# Patient Record
Sex: Female | Born: 1990 | Race: White | Hispanic: No | Marital: Married | Smoking: Former smoker
Health system: Southern US, Community
[De-identification: ages and names within clinical notes are randomized; demographics above are authoritative.]

## PROBLEM LIST (undated history)

## (undated) ENCOUNTER — Ambulatory Visit (HOSPITAL_BASED_OUTPATIENT_CLINIC_OR_DEPARTMENT_OTHER)

## (undated) DIAGNOSIS — I671 Cerebral aneurysm, nonruptured: Secondary | ICD-10-CM

## (undated) DIAGNOSIS — K219 Gastro-esophageal reflux disease without esophagitis: Secondary | ICD-10-CM

## (undated) DIAGNOSIS — F419 Anxiety disorder, unspecified: Secondary | ICD-10-CM

## (undated) DIAGNOSIS — Z62810 Personal history of physical and sexual abuse in childhood: Secondary | ICD-10-CM

## (undated) DIAGNOSIS — Q282 Arteriovenous malformation of cerebral vessels: Secondary | ICD-10-CM

## (undated) DIAGNOSIS — Z87442 Personal history of urinary calculi: Secondary | ICD-10-CM

## (undated) DIAGNOSIS — R87629 Unspecified abnormal cytological findings in specimens from vagina: Secondary | ICD-10-CM

## (undated) DIAGNOSIS — F329 Major depressive disorder, single episode, unspecified: Secondary | ICD-10-CM

## (undated) DIAGNOSIS — A609 Anogenital herpesviral infection, unspecified: Secondary | ICD-10-CM

## (undated) DIAGNOSIS — F32A Depression, unspecified: Secondary | ICD-10-CM

## (undated) HISTORY — PX: COLPOSCOPY: SHX161

## (undated) HISTORY — DX: Anogenital herpesviral infection, unspecified: A60.9

## (undated) HISTORY — DX: Major depressive disorder, single episode, unspecified: F32.9

## (undated) HISTORY — PX: TOOTH EXTRACTION: SUR596

## (undated) HISTORY — DX: Cerebral aneurysm, nonruptured: I67.1

## (undated) HISTORY — DX: Gastro-esophageal reflux disease without esophagitis: K21.9

## (undated) HISTORY — PX: TONSILLECTOMY: SUR1361

## (undated) HISTORY — DX: Personal history of physical and sexual abuse in childhood: Z62.810

## (undated) HISTORY — DX: Depression, unspecified: F32.A

## (undated) HISTORY — PX: MOUTH SURGERY: SHX715

## (undated) HISTORY — DX: Arteriovenous malformation of cerebral vessels: Q28.2

## (undated) HISTORY — DX: Anxiety disorder, unspecified: F41.9

---

## 2017-05-09 LAB — OB RESULTS CONSOLE GC/CHLAMYDIA
CHLAMYDIA, DNA PROBE: NEGATIVE
GC PROBE AMP, GENITAL: NEGATIVE

## 2017-05-09 LAB — OB RESULTS CONSOLE HIV ANTIBODY (ROUTINE TESTING): HIV: NONREACTIVE

## 2017-05-09 LAB — OB RESULTS CONSOLE RUBELLA ANTIBODY, IGM: RUBELLA: IMMUNE

## 2017-05-09 LAB — OB RESULTS CONSOLE ABO/RH: RH Type: POSITIVE

## 2017-05-09 LAB — OB RESULTS CONSOLE RPR: RPR: NONREACTIVE

## 2017-05-09 LAB — OB RESULTS CONSOLE HEPATITIS B SURFACE ANTIGEN: Hepatitis B Surface Ag: NEGATIVE

## 2017-05-09 LAB — OB RESULTS CONSOLE ANTIBODY SCREEN: ANTIBODY SCREEN: NEGATIVE

## 2017-12-09 ENCOUNTER — Telehealth (HOSPITAL_COMMUNITY): Payer: Self-pay | Admitting: *Deleted

## 2017-12-09 ENCOUNTER — Encounter (HOSPITAL_COMMUNITY): Payer: Self-pay | Admitting: *Deleted

## 2017-12-09 LAB — OB RESULTS CONSOLE GBS: STREP GROUP B AG: POSITIVE

## 2017-12-09 NOTE — Telephone Encounter (Signed)
Preadmission screen  

## 2017-12-20 ENCOUNTER — Inpatient Hospital Stay (HOSPITAL_COMMUNITY): Admission: AD | Admit: 2017-12-20 | Payer: Self-pay | Source: Ambulatory Visit | Admitting: Obstetrics and Gynecology
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
## 2017-12-23 ENCOUNTER — Encounter (HOSPITAL_COMMUNITY)
Admission: RE | Disposition: A | Discharge status: Home / Self Care | Payer: Self-pay | Source: Ambulatory Visit | Attending: Obstetrics and Gynecology

## 2017-12-23 ENCOUNTER — Encounter (HOSPITAL_COMMUNITY): Payer: Self-pay

## 2017-12-23 ENCOUNTER — Inpatient Hospital Stay (HOSPITAL_COMMUNITY): Payer: Managed Care, Other (non HMO) | Admitting: Anesthesiology

## 2017-12-23 ENCOUNTER — Inpatient Hospital Stay (HOSPITAL_COMMUNITY)
Admission: RE | Admit: 2017-12-23 | Discharge: 2017-12-25 | DRG: 787 | Disposition: A | Discharge status: Home / Self Care | Payer: Managed Care, Other (non HMO) | Source: Ambulatory Visit | Attending: Obstetrics and Gynecology | Admitting: Obstetrics and Gynecology

## 2017-12-23 ENCOUNTER — Other Ambulatory Visit: Payer: Self-pay

## 2017-12-23 VITALS — BP 114/61 | HR 67 | Temp 98.2°F | Resp 18 | Ht 67.0 in | Wt 171.0 lb

## 2017-12-23 DIAGNOSIS — O324XX0 Maternal care for high head at term, not applicable or unspecified: Secondary | ICD-10-CM | POA: Diagnosis present

## 2017-12-23 DIAGNOSIS — O480 Post-term pregnancy: Principal | ICD-10-CM | POA: Diagnosis present

## 2017-12-23 DIAGNOSIS — Z3A40 40 weeks gestation of pregnancy: Secondary | ICD-10-CM | POA: Diagnosis not present

## 2017-12-23 DIAGNOSIS — O99824 Streptococcus B carrier state complicating childbirth: Secondary | ICD-10-CM | POA: Diagnosis present

## 2017-12-23 DIAGNOSIS — A6000 Herpesviral infection of urogenital system, unspecified: Secondary | ICD-10-CM | POA: Diagnosis present

## 2017-12-23 DIAGNOSIS — Z98891 History of uterine scar from previous surgery: Secondary | ICD-10-CM

## 2017-12-23 DIAGNOSIS — Z3490 Encounter for supervision of normal pregnancy, unspecified, unspecified trimester: Secondary | ICD-10-CM

## 2017-12-23 DIAGNOSIS — O9832 Other infections with a predominantly sexual mode of transmission complicating childbirth: Secondary | ICD-10-CM | POA: Diagnosis present

## 2017-12-23 LAB — TYPE AND SCREEN
ABO/RH(D): O POS
Antibody Screen: NEGATIVE

## 2017-12-23 LAB — CBC
HEMATOCRIT: 37.3 % (ref 36.0–46.0)
Hemoglobin: 12.7 g/dL (ref 12.0–15.0)
MCH: 32.3 pg (ref 26.0–34.0)
MCHC: 34 g/dL (ref 30.0–36.0)
MCV: 94.9 fL (ref 78.0–100.0)
PLATELETS: 146 10*3/uL — AB (ref 150–400)
RBC: 3.93 MIL/uL (ref 3.87–5.11)
RDW: 13.4 % (ref 11.5–15.5)
WBC: 11.4 10*3/uL — AB (ref 4.0–10.5)

## 2017-12-23 LAB — ABO/RH: ABO/RH(D): O POS

## 2017-12-23 LAB — RPR: RPR Ser Ql: NONREACTIVE

## 2017-12-23 SURGERY — Surgical Case
Anesthesia: Epidural

## 2017-12-23 MED ORDER — NALBUPHINE HCL 10 MG/ML IJ SOLN: 5.0000 mg | Freq: Once | INTRAMUSCULAR | Status: DC | PRN

## 2017-12-23 MED ORDER — DIPHENHYDRAMINE HCL 25 MG PO CAPS: 25.0000 mg | ORAL_CAPSULE | ORAL | Status: DC | PRN

## 2017-12-23 MED ORDER — CEFAZOLIN SODIUM-DEXTROSE 2-4 GM/100ML-% IV SOLN
2.0000 g | Freq: Once | INTRAVENOUS | Status: DC
  Filled 2017-12-23: qty 100

## 2017-12-23 MED ORDER — DIPHENHYDRAMINE HCL 50 MG/ML IJ SOLN: 12.5000 mg | INTRAMUSCULAR | Status: DC | PRN

## 2017-12-23 MED ORDER — FENTANYL 2.5 MCG/ML BUPIVACAINE 1/10 % EPIDURAL INFUSION (WH - ANES)
INTRAMUSCULAR | Status: AC
  Filled 2017-12-23: qty 100

## 2017-12-23 MED ORDER — OXYCODONE-ACETAMINOPHEN 5-325 MG PO TABS: 2.0000 | ORAL_TABLET | ORAL | Status: DC | PRN

## 2017-12-23 MED ORDER — PENICILLIN G POT IN DEXTROSE 60000 UNIT/ML IV SOLN
3.0000 10*6.[IU] | INTRAVENOUS | Status: DC
  Administered 2017-12-23 (×2): 3 10*6.[IU] via INTRAVENOUS
  Filled 2017-12-23 (×6): qty 50

## 2017-12-23 MED ORDER — SIMETHICONE 80 MG PO CHEW
80.0000 mg | CHEWABLE_TABLET | ORAL | Status: DC
  Administered 2017-12-24 (×2): 80 mg via ORAL
  Filled 2017-12-23 (×2): qty 1

## 2017-12-23 MED ORDER — FENTANYL CITRATE (PF) 100 MCG/2ML IJ SOLN
50.0000 ug | INTRAMUSCULAR | Status: DC | PRN
  Administered 2017-12-23: 100 ug via INTRAVENOUS
  Administered 2017-12-23: 50 ug via INTRAVENOUS
  Administered 2017-12-23: 100 ug via INTRAVENOUS
  Filled 2017-12-23 (×3): qty 2

## 2017-12-23 MED ORDER — OXYTOCIN 40 UNITS IN LACTATED RINGERS INFUSION - SIMPLE MED: 2.5000 [IU]/h | INTRAVENOUS | Status: DC

## 2017-12-23 MED ORDER — PHENYLEPHRINE 40 MCG/ML (10ML) SYRINGE FOR IV PUSH (FOR BLOOD PRESSURE SUPPORT): 80.0000 ug | PREFILLED_SYRINGE | INTRAVENOUS | Status: DC | PRN

## 2017-12-23 MED ORDER — SODIUM BICARBONATE 8.4 % IV SOLN
INTRAVENOUS | Status: DC | PRN
  Administered 2017-12-23: 5 mL via EPIDURAL
  Administered 2017-12-23: 2 mL via EPIDURAL
  Administered 2017-12-23: 3 mL via EPIDURAL

## 2017-12-23 MED ORDER — PRENATAL MULTIVITAMIN CH
1.0000 | ORAL_TABLET | Freq: Every day | ORAL | Status: DC
  Administered 2017-12-24 – 2017-12-25 (×2): 1 via ORAL
  Filled 2017-12-23 (×2): qty 1

## 2017-12-23 MED ORDER — MISOPROSTOL 25 MCG QUARTER TABLET
25.0000 ug | ORAL_TABLET | ORAL | Status: DC | PRN
  Administered 2017-12-23 (×2): 25 ug via VAGINAL
  Filled 2017-12-23 (×2): qty 1

## 2017-12-23 MED ORDER — TETANUS-DIPHTH-ACELL PERTUSSIS 5-2.5-18.5 LF-MCG/0.5 IM SUSP: 0.5000 mL | Freq: Once | INTRAMUSCULAR | Status: DC

## 2017-12-23 MED ORDER — TERBUTALINE SULFATE 1 MG/ML IJ SOLN: 0.2500 mg | Freq: Once | INTRAMUSCULAR | Status: DC | PRN

## 2017-12-23 MED ORDER — SODIUM CHLORIDE 0.9% FLUSH: 3.0000 mL | INTRAVENOUS | Status: DC | PRN

## 2017-12-23 MED ORDER — SODIUM CHLORIDE 0.9 % IV SOLN
5.0000 10*6.[IU] | Freq: Once | INTRAVENOUS | Status: AC
  Administered 2017-12-23: 5 10*6.[IU] via INTRAVENOUS
  Filled 2017-12-23: qty 5

## 2017-12-23 MED ORDER — FENTANYL 2.5 MCG/ML BUPIVACAINE 1/10 % EPIDURAL INFUSION (WH - ANES)
14.0000 mL/h | INTRAMUSCULAR | Status: DC | PRN
  Administered 2017-12-23 (×2): 14 mL/h via EPIDURAL
  Filled 2017-12-23: qty 100

## 2017-12-23 MED ORDER — OXYTOCIN BOLUS FROM INFUSION: 500.0000 mL | Freq: Once | INTRAVENOUS | Status: DC

## 2017-12-23 MED ORDER — KETOROLAC TROMETHAMINE 30 MG/ML IJ SOLN
30.0000 mg | Freq: Once | INTRAMUSCULAR | Status: AC
  Administered 2017-12-23: 30 mg via INTRAVENOUS

## 2017-12-23 MED ORDER — PHENYLEPHRINE 40 MCG/ML (10ML) SYRINGE FOR IV PUSH (FOR BLOOD PRESSURE SUPPORT)
PREFILLED_SYRINGE | INTRAVENOUS | Status: AC
  Filled 2017-12-23: qty 10

## 2017-12-23 MED ORDER — MEPERIDINE HCL 25 MG/ML IJ SOLN: 6.2500 mg | INTRAMUSCULAR | Status: DC | PRN

## 2017-12-23 MED ORDER — DEXAMETHASONE SODIUM PHOSPHATE 4 MG/ML IJ SOLN
INTRAMUSCULAR | Status: DC | PRN
  Administered 2017-12-23: 4 mg via INTRAVENOUS

## 2017-12-23 MED ORDER — MORPHINE SULFATE (PF) 0.5 MG/ML IJ SOLN
INTRAMUSCULAR | Status: AC
Start: 2017-12-23 — End: 2017-12-23
  Filled 2017-12-23: qty 10

## 2017-12-23 MED ORDER — LACTATED RINGERS IV SOLN: 500.0000 mL | INTRAVENOUS | Status: DC | PRN

## 2017-12-23 MED ORDER — OXYTOCIN 40 UNITS IN LACTATED RINGERS INFUSION - SIMPLE MED: 2.5000 [IU]/h | INTRAVENOUS | Status: AC

## 2017-12-23 MED ORDER — LIDOCAINE-EPINEPHRINE (PF) 2 %-1:200000 IJ SOLN: INTRAMUSCULAR | Status: DC | PRN

## 2017-12-23 MED ORDER — FENTANYL CITRATE (PF) 100 MCG/2ML IJ SOLN: 25.0000 ug | INTRAMUSCULAR | Status: DC | PRN

## 2017-12-23 MED ORDER — LIDOCAINE HCL (PF) 1 % IJ SOLN
INTRAMUSCULAR | Status: DC | PRN
  Administered 2017-12-23: 5 mL via EPIDURAL
  Administered 2017-12-23: 4 mL via EPIDURAL

## 2017-12-23 MED ORDER — OXYTOCIN 10 UNIT/ML IJ SOLN
INTRAVENOUS | Status: DC | PRN
  Administered 2017-12-23: 40 [IU] via INTRAVENOUS

## 2017-12-23 MED ORDER — ACETAMINOPHEN 325 MG PO TABS: 650.0000 mg | ORAL_TABLET | ORAL | Status: DC | PRN

## 2017-12-23 MED ORDER — LACTATED RINGERS IV SOLN
INTRAVENOUS | Status: DC | PRN
  Administered 2017-12-23: 18:00:00 via INTRAVENOUS

## 2017-12-23 MED ORDER — ONDANSETRON HCL 4 MG/2ML IJ SOLN: 4.0000 mg | Freq: Three times a day (TID) | INTRAMUSCULAR | Status: DC | PRN

## 2017-12-23 MED ORDER — NALOXONE HCL 0.4 MG/ML IJ SOLN: 0.4000 mg | INTRAMUSCULAR | Status: DC | PRN

## 2017-12-23 MED ORDER — LACTATED RINGERS IV SOLN
INTRAVENOUS | Status: DC
  Administered 2017-12-23 (×2): via INTRAVENOUS

## 2017-12-23 MED ORDER — ONDANSETRON HCL 4 MG/2ML IJ SOLN
4.0000 mg | Freq: Four times a day (QID) | INTRAMUSCULAR | Status: DC | PRN
  Administered 2017-12-23: 4 mg via INTRAVENOUS
  Filled 2017-12-23: qty 2

## 2017-12-23 MED ORDER — OXYTOCIN 10 UNIT/ML IJ SOLN
INTRAMUSCULAR | Status: AC
  Filled 2017-12-23: qty 4

## 2017-12-23 MED ORDER — FLEET ENEMA 7-19 GM/118ML RE ENEM: 1.0000 | ENEMA | RECTAL | Status: DC | PRN

## 2017-12-23 MED ORDER — ACETAMINOPHEN 325 MG PO TABS
650.0000 mg | ORAL_TABLET | ORAL | Status: DC | PRN
  Administered 2017-12-24 – 2017-12-25 (×5): 650 mg via ORAL
  Filled 2017-12-23 (×5): qty 2

## 2017-12-23 MED ORDER — COCONUT OIL OIL
1.0000 "application " | TOPICAL_OIL | Status: DC | PRN
  Administered 2017-12-25: 1 via TOPICAL
  Filled 2017-12-23: qty 120

## 2017-12-23 MED ORDER — FENTANYL CITRATE (PF) 100 MCG/2ML IJ SOLN
INTRAMUSCULAR | Status: DC | PRN
  Administered 2017-12-23: 50 ug via EPIDURAL
  Administered 2017-12-23: 50 ug via INTRAVENOUS

## 2017-12-23 MED ORDER — LIDOCAINE-EPINEPHRINE (PF) 2 %-1:200000 IJ SOLN
INTRAMUSCULAR | Status: DC | PRN
  Administered 2017-12-23: 10 mL via INTRADERMAL

## 2017-12-23 MED ORDER — MENTHOL 3 MG MT LOZG
1.0000 | LOZENGE | OROMUCOSAL | Status: DC | PRN
Start: 2017-12-23 — End: 2017-12-25

## 2017-12-23 MED ORDER — OXYCODONE-ACETAMINOPHEN 5-325 MG PO TABS: 1.0000 | ORAL_TABLET | ORAL | Status: DC | PRN

## 2017-12-23 MED ORDER — SIMETHICONE 80 MG PO CHEW: 80.0000 mg | CHEWABLE_TABLET | ORAL | Status: DC | PRN

## 2017-12-23 MED ORDER — NALBUPHINE HCL 10 MG/ML IJ SOLN: 5.0000 mg | INTRAMUSCULAR | Status: DC | PRN

## 2017-12-23 MED ORDER — SODIUM CHLORIDE 0.9 % IR SOLN
Status: DC | PRN
  Administered 2017-12-23: 1

## 2017-12-23 MED ORDER — LIDOCAINE HCL (PF) 1 % IJ SOLN: 30.0000 mL | INTRAMUSCULAR | Status: DC | PRN

## 2017-12-23 MED ORDER — EPHEDRINE 5 MG/ML INJ: 10.0000 mg | INTRAVENOUS | Status: DC | PRN

## 2017-12-23 MED ORDER — MORPHINE SULFATE (PF) 0.5 MG/ML IJ SOLN
INTRAMUSCULAR | Status: DC | PRN
  Administered 2017-12-23: 3 mg via EPIDURAL

## 2017-12-23 MED ORDER — SIMETHICONE 80 MG PO CHEW
80.0000 mg | CHEWABLE_TABLET | Freq: Three times a day (TID) | ORAL | Status: DC
  Administered 2017-12-24 – 2017-12-25 (×5): 80 mg via ORAL
  Filled 2017-12-23 (×5): qty 1

## 2017-12-23 MED ORDER — OXYCODONE HCL 5 MG PO TABS
5.0000 mg | ORAL_TABLET | ORAL | Status: DC | PRN
  Administered 2017-12-24 – 2017-12-25 (×3): 5 mg via ORAL
  Filled 2017-12-23 (×3): qty 1

## 2017-12-23 MED ORDER — CEFAZOLIN SODIUM-DEXTROSE 2-3 GM-%(50ML) IV SOLR
INTRAVENOUS | Status: DC | PRN
  Administered 2017-12-23: 2 g via INTRAVENOUS

## 2017-12-23 MED ORDER — SCOPOLAMINE 1 MG/3DAYS TD PT72
MEDICATED_PATCH | TRANSDERMAL | Status: AC
  Filled 2017-12-23: qty 1

## 2017-12-23 MED ORDER — IBUPROFEN 600 MG PO TABS
600.0000 mg | ORAL_TABLET | Freq: Four times a day (QID) | ORAL | Status: DC
  Administered 2017-12-24 – 2017-12-25 (×7): 600 mg via ORAL
  Filled 2017-12-23 (×7): qty 1

## 2017-12-23 MED ORDER — LIDOCAINE-EPINEPHRINE (PF) 2 %-1:200000 IJ SOLN
INTRAMUSCULAR | Status: AC
  Filled 2017-12-23: qty 20

## 2017-12-23 MED ORDER — DIBUCAINE 1 % RE OINT: 1.0000 "application " | TOPICAL_OINTMENT | RECTAL | Status: DC | PRN

## 2017-12-23 MED ORDER — SCOPOLAMINE 1 MG/3DAYS TD PT72
MEDICATED_PATCH | TRANSDERMAL | Status: DC | PRN
  Administered 2017-12-23: 1 via TRANSDERMAL

## 2017-12-23 MED ORDER — LACTATED RINGERS IV SOLN: 500.0000 mL | Freq: Once | INTRAVENOUS | Status: DC

## 2017-12-23 MED ORDER — LACTATED RINGERS IV SOLN
INTRAVENOUS | Status: DC | PRN
  Administered 2017-12-23: 17:00:00 via INTRAVENOUS

## 2017-12-23 MED ORDER — LACTATED RINGERS IV SOLN
500.0000 mL | Freq: Once | INTRAVENOUS | Status: AC
  Administered 2017-12-23: 500 mL via INTRAVENOUS

## 2017-12-23 MED ORDER — OXYTOCIN 40 UNITS IN LACTATED RINGERS INFUSION - SIMPLE MED
1.0000 m[IU]/min | INTRAVENOUS | Status: DC
  Administered 2017-12-23: 2 m[IU]/min via INTRAVENOUS
  Filled 2017-12-23: qty 1000

## 2017-12-23 MED ORDER — ZOLPIDEM TARTRATE 5 MG PO TABS: 5.0000 mg | ORAL_TABLET | Freq: Every evening | ORAL | Status: DC | PRN

## 2017-12-23 MED ORDER — NALOXONE HCL 4 MG/10ML IJ SOLN: 1.0000 ug/kg/h | INTRAVENOUS | Status: DC | PRN

## 2017-12-23 MED ORDER — LACTATED RINGERS IV SOLN
INTRAVENOUS | Status: DC
  Administered 2017-12-24: 03:00:00 via INTRAVENOUS

## 2017-12-23 MED ORDER — OXYCODONE HCL 5 MG PO TABS
10.0000 mg | ORAL_TABLET | ORAL | Status: DC | PRN
  Administered 2017-12-24 – 2017-12-25 (×3): 10 mg via ORAL
  Filled 2017-12-23 (×3): qty 2

## 2017-12-23 MED ORDER — FENTANYL CITRATE (PF) 100 MCG/2ML IJ SOLN
INTRAMUSCULAR | Status: AC
  Filled 2017-12-23: qty 2

## 2017-12-23 MED ORDER — SENNOSIDES-DOCUSATE SODIUM 8.6-50 MG PO TABS
2.0000 | ORAL_TABLET | ORAL | Status: DC
  Administered 2017-12-24 (×2): 2 via ORAL
  Filled 2017-12-23 (×2): qty 2

## 2017-12-23 MED ORDER — DIPHENHYDRAMINE HCL 25 MG PO CAPS: 25.0000 mg | ORAL_CAPSULE | Freq: Four times a day (QID) | ORAL | Status: DC | PRN

## 2017-12-23 MED ORDER — DEXAMETHASONE SODIUM PHOSPHATE 4 MG/ML IJ SOLN
INTRAMUSCULAR | Status: AC
  Filled 2017-12-23: qty 1

## 2017-12-23 MED ORDER — ONDANSETRON HCL 4 MG/2ML IJ SOLN
INTRAMUSCULAR | Status: AC
  Filled 2017-12-23: qty 2

## 2017-12-23 MED ORDER — WITCH HAZEL-GLYCERIN EX PADS: 1.0000 "application " | MEDICATED_PAD | CUTANEOUS | Status: DC | PRN

## 2017-12-23 MED ORDER — KETOROLAC TROMETHAMINE 30 MG/ML IJ SOLN
INTRAMUSCULAR | Status: AC
  Filled 2017-12-23: qty 1

## 2017-12-23 MED ORDER — SOD CITRATE-CITRIC ACID 500-334 MG/5ML PO SOLN
30.0000 mL | ORAL | Status: DC | PRN
  Administered 2017-12-23: 30 mL via ORAL
  Filled 2017-12-23: qty 15

## 2017-12-23 SURGICAL SUPPLY — 37 items
BENZOIN TINCTURE PRP APPL 2/3 (GAUZE/BANDAGES/DRESSINGS) ×3 IMPLANT
CHLORAPREP W/TINT 26ML (MISCELLANEOUS) ×3 IMPLANT
CLAMP CORD UMBIL (MISCELLANEOUS) IMPLANT
CLOSURE WOUND 1/2 X4 (GAUZE/BANDAGES/DRESSINGS) ×1
CLOTH BEACON ORANGE TIMEOUT ST (SAFETY) ×3 IMPLANT
DERMABOND ADVANCED (GAUZE/BANDAGES/DRESSINGS) ×2
DERMABOND ADVANCED .7 DNX12 (GAUZE/BANDAGES/DRESSINGS) ×1 IMPLANT
DRSG OPSITE POSTOP 4X10 (GAUZE/BANDAGES/DRESSINGS) ×3 IMPLANT
ELECT REM PT RETURN 9FT ADLT (ELECTROSURGICAL) ×3
ELECTRODE REM PT RTRN 9FT ADLT (ELECTROSURGICAL) ×1 IMPLANT
EXTRACTOR VACUUM KIWI (MISCELLANEOUS) IMPLANT
GLOVE BIO SURGEON STRL SZ 6.5 (GLOVE) ×2 IMPLANT
GLOVE BIO SURGEONS STRL SZ 6.5 (GLOVE) ×1
GLOVE BIOGEL PI IND STRL 6.5 (GLOVE) ×1 IMPLANT
GLOVE BIOGEL PI IND STRL 7.0 (GLOVE) ×2 IMPLANT
GLOVE BIOGEL PI INDICATOR 6.5 (GLOVE) ×2
GLOVE BIOGEL PI INDICATOR 7.0 (GLOVE) ×4
GOWN STRL REUS W/TWL LRG LVL3 (GOWN DISPOSABLE) ×6 IMPLANT
KIT ABG SYR 3ML LUER SLIP (SYRINGE) ×6 IMPLANT
NEEDLE HYPO 25X5/8 SAFETYGLIDE (NEEDLE) ×6 IMPLANT
NS IRRIG 1000ML POUR BTL (IV SOLUTION) ×3 IMPLANT
PACK C SECTION WH (CUSTOM PROCEDURE TRAY) ×3 IMPLANT
PAD OB MATERNITY 4.3X12.25 (PERSONAL CARE ITEMS) ×3 IMPLANT
PENCIL SMOKE EVAC W/HOLSTER (ELECTROSURGICAL) ×3 IMPLANT
RETRACTOR WND ALEXIS 25 LRG (MISCELLANEOUS) IMPLANT
RTRCTR WOUND ALEXIS 25CM LRG (MISCELLANEOUS)
STRIP CLOSURE SKIN 1/2X4 (GAUZE/BANDAGES/DRESSINGS) ×2 IMPLANT
SUT MON AB 4-0 PS1 27 (SUTURE) ×3 IMPLANT
SUT PLAIN 0 NONE (SUTURE) IMPLANT
SUT PLAIN 2 0 (SUTURE) ×2
SUT PLAIN ABS 2-0 CT1 27XMFL (SUTURE) ×1 IMPLANT
SUT VIC AB 0 CT1 36 (SUTURE) ×3 IMPLANT
SUT VIC AB 0 CTX 36 (SUTURE) ×6
SUT VIC AB 0 CTX36XBRD ANBCTRL (SUTURE) ×3 IMPLANT
SUT VIC AB 4-0 PS2 27 (SUTURE) ×3 IMPLANT
TOWEL OR 17X24 6PK STRL BLUE (TOWEL DISPOSABLE) ×3 IMPLANT
TRAY FOLEY BAG SILVER LF 14FR (SET/KITS/TRAYS/PACK) IMPLANT

## 2017-12-23 NOTE — Anesthesia Procedure Notes (Signed)
Epidural Patient location during procedure: OB Start time: 12/23/2017 8:10 AM  Staffing Anesthesiologist: Mal AmabileFoster, Syon Tews, MD Performed: anesthesiologist   Preanesthetic Checklist Completed: patient identified, site marked, surgical consent, pre-op evaluation, timeout performed, IV checked, risks and benefits discussed and monitors and equipment checked  Epidural Patient position: sitting Prep: site prepped and draped and DuraPrep Patient monitoring: continuous pulse ox and blood pressure Approach: midline Location: L3-L4 Injection technique: LOR air  Needle:  Needle type: Tuohy  Needle gauge: 17 G Needle length: 9 cm and 9 Needle insertion depth: 5 cm cm Catheter type: closed end flexible Catheter size: 19 Gauge Catheter at skin depth: 10 cm Test dose: negative and Other  Assessment Events: blood not aspirated, injection not painful, no injection resistance, negative IV test and no paresthesia  Additional Notes Patient identified. Risks and benefits discussed including failed block, incomplete  Pain control, post dural puncture headache, nerve damage, paralysis, blood pressure Changes, nausea, vomiting, reactions to medications-both toxic and allergic and post Partum back pain. All questions were answered. Patient expressed understanding and wished to proceed. Sterile technique was used throughout procedure. Epidural site was Dressed with sterile barrier dressing. No paresthesias, signs of intravascular injection Or signs of intrathecal spread were encountered.  Patient was more comfortable after the epidural was dosed. Please see RN's note for documentation of vital signs and FHR which are stable.

## 2017-12-23 NOTE — Anesthesia Pain Management Evaluation Note (Signed)
  CRNA Pain Management Visit Note  Patient: Wendy Potter, 27 y.o., female  "Hello I am a member of the anesthesia team at [facility]. We have an anesthesia team available at all times to provide care throughout the hospital, including epidural management and anesthesia for C-section. I don't know your plan for the delivery whether it a natural birth, water birth, IV sedation, nitrous supplementation, doula or epidural, but we want to meet your pain goals."   1.Was your pain managed to your expectations on prior hospitalizations?   Yes   2.What is your expectation for pain management during this hospitalization?     Epidural  3.How can we help you reach that goal? Epidural recently placed and pt stated that she is not having any pain.  Record the patient's initial score and the patient's pain goal.   Pain: 0  Pain Goal: 0 [facility] wants you to be able to say your pain was always managed very well.  Wendy Potter 12/23/2017

## 2017-12-23 NOTE — Progress Notes (Signed)
Patient has been pushing for >1 hour and absolutely no descent of the fetal vertex. Effort has been excellent, IUPC shows great strength. D/w patient, husband, and grandmothers a primary C section for arrest of descent. Risks reviewed including infection, bleeding, damage to surrounding structures, and the need for additional procedures. Patient gave informed consent and desired to proceed with primary cesarean section. 2g ancef on call to OR.   Rosie FateE Kenny Rea MD

## 2017-12-23 NOTE — Transfer of Care (Signed)
Immediate Anesthesia Transfer of Care Note  Patient: Wendy FusiBrenda A Potter  Procedure(s) Performed: CESAREAN SECTION (N/A )  Patient Location: PACU  Anesthesia Type:Epidural  Level of Consciousness: awake, alert  and oriented  Airway & Oxygen Therapy: Patient Spontanous Breathing  Post-op Assessment: Report given to RN and Post -op Vital signs reviewed and stable  Post vital signs: Reviewed and stable  Last Vitals:  Vitals:   12/23/17 1701 12/23/17 1705  BP: 123/67 (!) 144/125  Pulse: 71 (!) 136  Resp:    Temp:      Last Pain:  Vitals:   12/23/17 1152  TempSrc:   PainSc: 3          Complications: No apparent anesthesia complications

## 2017-12-23 NOTE — Progress Notes (Addendum)
SVE 5/c/-2, AROM for clear fluid. CLE now.  S/p PCN x 3 doses.  Cont PCN per protocol.

## 2017-12-23 NOTE — Progress Notes (Signed)
9/c/+1, OT --> LOA Anticipate second stage soon

## 2017-12-23 NOTE — Op Note (Signed)
PROCEDURE DATE: 12/23/17  PREOPERATIVE DIAGNOSIS: Intrauterine pregnancy at 40.3 wga, Indication: arrest of descent  POSTOPERATIVE DIAGNOSIS:The same  PROCEDURE: Primary Low TransverseCesarean Section  SURGEON: Dr. Belva AgeeElise Mahmoud Blazejewski  INDICATIONS:This is a 26yo G1 at 40.3 wga requiring cesarean section secondary to arrest of descent. She progressed well through labor, pushed for >1 hour without any descent. Decision made to proceed with pLTCS.The risks of cesarean section discussed with the patient included but were not limited to: bleeding which may require transfusion or reoperation; infection which may require antibiotics; injury to bowel, bladder, ureters or other surrounding organs; injury to the fetus; need for additional procedures including hysterectomy in the event of a life-threatening hemorrhage; placental abnormalities wth subsequent pregnancies, incisional problems, thromboembolic phenomenon and other postoperative/anesthesia complications. The patient agreed with the proposed plan, giving informed consent for the procedure.   FINDINGS: Viable maleinfant in vertex presentation,APGARs pending, Weight pending, Amniotic fluid clear, Intact placenta, three vessel cord. Grossly normal uterus, ovaries and fallopian tubes. .  ANESTHESIA: Epidural ESTIMATED BLOOD LOSS: 635cc SPECIMENS: Placenta for routine COMPLICATIONS: None immediate  PROCEDURE IN DETAIL: The patient received intravenous antibiotics (2g Ancef) and had sequential compression devices applied to her lower extremities while in the preoperative area. Shewasthen taken to the operating roomwhere epidural anesthesiawas dosed up to surgical level andwas found to be adequate. She was then placed in a dorsal supine position with a leftward tilt,and prepped and draped in a sterile manner.A foley catheter was placed into her bladder and attached to constant gravity. After an adequate timeout was performed,  aPfannenstiel skin incision was made with scalpel and carried through to the underlying layer of fascia. The fascia was incised in the midline and this incision was extended bilaterally using the Mayo scissors. Kocher clamps were applied to the superior aspect of the fascial incision and the underlying rectus muscles were dissected off bluntly. A similar process was carried out on the inferior aspect of the facial incision. The rectus muscles were separated in the midline bluntly and the peritoneum was entered bluntly. A bladder flap was created sharply and developed bluntly.Atransverse hysterotomy was made with a scalpel and extended bilaterally bluntly. The bladder blade was then removed. The infant was successfully delivered, and cord was clamped and cut and infant was handed over to awaiting neonatology team. Uterine massage was then administered and the placenta delivered intact with three-vessel cord. Cord gases were taken. The uterus was cleared of clot and debris. The hysterotomy was closed with 0 vicryl.A second imbricating suture of 0-vicryl was used to reinforce the incision and aid in hemostasis.The fascia was closed with 0-Vicryl in a running fashion with good restoration of anatomy. The subcutaneus tissue was irrigated and was reapproximated using three interrupted plain gut stitches. The skin was closed with 4-0 Vicryl in a subcuticular fashion.  Final EBL was 635cc (all surgical site and was hemostatic at end of procedure) without any further bleeding on exam.   Pt tolerated the procedure well. All sponge/lap/needle counts were correct X 2. Pt taken to recovery room in stable condition.   Belva AgeeElise Heberto Sturdevant MD

## 2017-12-23 NOTE — Anesthesia Preprocedure Evaluation (Addendum)
Anesthesia Evaluation  Patient identified by MRN, date of birth, ID band Patient awake    Reviewed: Allergy & Precautions, H&P , NPO status , Patient's Chart, lab work & pertinent test results  Airway Mallampati: II  TM Distance: >3 FB Neck ROM: full    Dental no notable dental hx. (+) Teeth Intact   Pulmonary neg pulmonary ROS, Current Smoker,    Pulmonary exam normal breath sounds clear to auscultation       Cardiovascular negative cardio ROS Normal cardiovascular exam Rhythm:regular Rate:Normal     Neuro/Psych negative neurological ROS  negative psych ROS   GI/Hepatic Neg liver ROS, GERD  Medicated and Controlled,  Endo/Other  negative endocrine ROS  Renal/GU negative Renal ROS  negative genitourinary   Musculoskeletal   Abdominal   Peds  Hematology negative hematology ROS (+)   Anesthesia Other Findings   Reproductive/Obstetrics (+) Pregnancy HSV                            Anesthesia Physical Anesthesia Plan  ASA: II and emergent  Anesthesia Plan: Epidural   Post-op Pain Management:    Induction:   PONV Risk Score and Plan:   Airway Management Planned: Natural Airway  Additional Equipment:   Intra-op Plan:   Post-operative Plan:   Informed Consent: I have reviewed the patients History and Physical, chart, labs and discussed the procedure including the risks, benefits and alternatives for the proposed anesthesia with the patient or authorized representative who has indicated his/her understanding and acceptance.   Dental advisory given  Plan Discussed with: CRNA and Surgeon  Anesthesia Plan Comments: (For urgent C/Section for arrest of descent. Will use epidural catheter.)       Anesthesia Quick Evaluation

## 2017-12-23 NOTE — Brief Op Note (Signed)
12/23/2017  7:25 PM  PATIENT:  Wendy FusiBrenda A Smouse  27 y.o. female  PRE-OPERATIVE DIAGNOSIS:  arrest of descent   POST-OPERATIVE DIAGNOSIS:  arrest of descent   PROCEDURE:  Procedure(s): CESAREAN SECTION (N/A)  SURGEON:  Surgeon(s) and Role:    * Mellody Masri, Madelaine EtienneElise Jennifer, MD - Primary  PHYSICIAN ASSISTANT:   ASSISTANTS: Mamie Leversracy Tucker, RfNA  ANESTHESIA:   epidural  EBL:  635 mL   BLOOD ADMINISTERED:none  DRAINS: Urinary Catheter (Foley)   LOCAL MEDICATIONS USED:  NONE  SPECIMEN:  Source of Specimen:  placenta  DISPOSITION OF SPECIMEN:  L&D  COUNTS:  YES  TOURNIQUET:  * No tourniquets in log *  DICTATION: .Note written in EPIC  PLAN OF CARE: Admit to inpatient   PATIENT DISPOSITION:  PACU - hemodynamically stable.   Delay start of Pharmacological VTE agent (>24hrs) due to surgical blood loss or risk of bleeding: not applicable

## 2017-12-23 NOTE — Anesthesia Postprocedure Evaluation (Signed)
Anesthesia Post Note  Patient: Wendy Potter  Procedure(s) Performed: CESAREAN SECTION (N/A )     Patient location during evaluation: Mother Baby Anesthesia Type: Epidural Level of consciousness: awake and alert Pain management: pain level controlled Vital Signs Assessment: post-procedure vital signs reviewed and stable Respiratory status: spontaneous breathing, nonlabored ventilation and respiratory function stable Cardiovascular status: stable Postop Assessment: no headache, no backache and epidural receding Anesthetic complications: no    Last Vitals:  Vitals:   12/23/17 1945 12/23/17 1947  BP: (!) 78/50 113/68  Pulse: 72 85  Resp: 12 20  Temp:    SpO2: 95% 96%    Last Pain:  Vitals:   12/23/17 1945  TempSrc:   PainSc: 0-No pain   Pain Goal:                 Lowella CurbWarren Ray Harriet Bollen

## 2017-12-24 ENCOUNTER — Encounter (HOSPITAL_COMMUNITY): Payer: Self-pay | Admitting: Obstetrics and Gynecology

## 2017-12-24 LAB — CBC
HCT: 29.4 % — ABNORMAL LOW (ref 36.0–46.0)
Hemoglobin: 10.2 g/dL — ABNORMAL LOW (ref 12.0–15.0)
MCH: 32.4 pg (ref 26.0–34.0)
MCHC: 34.7 g/dL (ref 30.0–36.0)
MCV: 93.3 fL (ref 78.0–100.0)
PLATELETS: 122 10*3/uL — AB (ref 150–400)
RBC: 3.15 MIL/uL — ABNORMAL LOW (ref 3.87–5.11)
RDW: 13.7 % (ref 11.5–15.5)
WBC: 14.8 10*3/uL — AB (ref 4.0–10.5)
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
## 2017-12-24 NOTE — Addendum Note (Signed)
Addendum  created 12/24/17 1305 by Graciela HusbandsFussell, Aeryn Medici O, CRNA   Sign clinical note

## 2017-12-24 NOTE — Lactation Note (Signed)
This note was copied from a baby's chart. Lactation Consultation Note  Patient Name: Boy Larey SeatBrenda Edds ZOXWR'UToday's Date: 12/24/2017 Reason for consult: Initial assessment   P1, Baby 21 hours old.   Mother states RN has reviewed hand expression and positioning. She denies concerns or questions. Mom encouraged to feed baby 8-12 times/24 hours and with feeding cues.  Mom made aware of O/P services, breastfeeding support groups, community resources, and our phone # for post-discharge questions.  Suggest calling if she needs further assistance.   Maternal Data Has patient been taught Hand Expression?: Yes Does the patient have breastfeeding experience prior to this delivery?: No  Feeding Feeding Type: Breast Fed Length of feed: 20 min  LATCH Score Latch: Grasps breast easily, tongue down, lips flanged, rhythmical sucking.  Audible Swallowing: A few with stimulation  Type of Nipple: Everted at rest and after stimulation  Comfort (Breast/Nipple): Soft / non-tender  Hold (Positioning): No assistance needed to correctly position infant at breast.  LATCH Score: 9  Interventions    Lactation Tools Discussed/Used     Consult Status Consult Status: Follow-up Date: 12/25/17 Follow-up type: In-patient    Dahlia ByesBerkelhammer, Ruth [facility] 12/24/2017, 3:23 PM

## 2017-12-24 NOTE — Anesthesia Postprocedure Evaluation (Signed)
Anesthesia Post Note  Patient: Wendy Potter  Procedure(s) Performed: CESAREAN SECTION (N/A )     Patient location during evaluation: Mother Baby Anesthesia Type: Epidural Level of consciousness: awake and alert and oriented Pain management: satisfactory to patient Vital Signs Assessment: post-procedure vital signs reviewed and stable Respiratory status: respiratory function stable Cardiovascular status: stable Postop Assessment: no headache, no backache, epidural receding, patient able to bend at knees, no signs of nausea or vomiting and adequate PO intake Anesthetic complications: no    Last Vitals:  Vitals:   12/24/17 0900 12/24/17 0957  BP:  (!) 111/53  Pulse:  76  Resp:  16  Temp:  36.6 C  SpO2: 99% 98%    Last Pain:  Vitals:   12/24/17 1207  TempSrc:   PainSc: 3    Pain Goal: Patients Stated Pain Goal: 3 (12/24/17 1207)               Karleen DolphinFUSSELL,Wendy Potter

## 2017-12-24 NOTE — Progress Notes (Signed)
Subjective: Postpartum Day 1: Cesarean Delivery Patient reports tolerating PO.    Objective: Vital signs in last 24 hours: Temp:  [97.3 F (36.3 C)-98.8 F (37.1 C)] 98.3 F (36.8 C) (02/16 0614) Pulse Rate:  [61-249] 71 (02/16 0614) Resp:  [12-22] 18 (02/16 0614) BP: (78-149)/(41-125) 110/42 (02/16 0614) SpO2:  [95 %-98 %] 95 % (02/16 16100614)  Physical Exam:  General: alert, cooperative and appears stated age Lochia: appropriate Uterine Fundus: firm Incision: healing well, no significant drainage, no dehiscence, no significant erythema DVT Evaluation: No evidence of DVT seen on physical exam. Negative Homan's sign. No cords or calf tenderness. No significant calf/ankle edema.  Recent Labs    12/23/17 0028 12/24/17 0526  HGB 12.7 10.2*  HCT 37.3 29.4*    Assessment/Plan: Status post Cesarean section. Doing well postoperatively.  circ today - risks discussed including infxn, bleeding, damage to penis and poor cosmesis. Informed consent obtained.    Wendy Potter 12/24/2017, 8:25 AM

## 2017-12-25 NOTE — Progress Notes (Signed)
Subjective: Postpartum Day 2: Cesarean Delivery Patient reports tolerating PO, + flatus and no problems voiding.    Objective: Vital signs in last 24 hours: Temp:  [97.8 F (36.6 C)-99.1 F (37.3 C)] 98.2 F (36.8 C) (02/17 0500) Pulse Rate:  [65-83] 67 (02/17 0500) Resp:  [16-20] 18 (02/17 0500) BP: (108-114)/(49-61) 114/61 (02/17 0500) SpO2:  [97 %-98 %] 98 % (02/16 1745)  Physical Exam:  General: alert, cooperative and appears stated age Lochia: appropriate Uterine Fundus: firm Incision: healing well, no significant drainage, no dehiscence, no significant erythema DVT Evaluation: No evidence of DVT seen on physical exam. Negative Homan's sign. No cords or calf tenderness. No significant calf/ankle edema.  Recent Labs    12/23/17 0028 12/24/17 0526  HGB 12.7 10.2*  HCT 37.3 29.4*    Assessment/Plan: Status post Cesarean section. Doing well postoperatively.  Continue current care. S/p circ yesterday.    Ranae Pilalise Jennifer Leger 12/25/2017, 9:14 AM

## 2017-12-25 NOTE — Discharge Summary (Signed)
Obstetric Discharge Summary Reason for Admission: induction of labor Prenatal Procedures: None Intrapartum Procedures: cesarean: low cervical, transverse, s/s arrest of descent Postpartum Procedures: none Complications-Operative and Postpartum: none  Hemoglobin  Date Value Ref Range Status  12/24/2017 10.2 (L) 12.0 - 15.0 g/dL Final   HCT  Date Value Ref Range Status  12/24/2017 29.4 (L) 36.0 - 46.0 % Final    Physical Exam:  General: alert, cooperative and appears stated age 21Lochia: appropriate Uterine Fundus: firm Incision: c/d/i DVT Evaluation: negative  Discharge Diagnoses: Term Pregnancy-delivered  Discharge Information: Date: 12/25/2017 Activity: pelvic rest Diet: routine Medications: oycodone given in paper script Condition: stable Instructions: refer to practice specific booklet Discharge to: home   Newborn Data: Live born female  Birth Weight: 7 lb 1.8 oz (3225 g) APGAR: 10, 10  Newborn Delivery   Birth date/time:  12/23/2017 17:29:00 Delivery type:  C-Section, Low Transverse C-section categorization:  Primary     Home with mother.  Wendy Potter Jennifer Laddie Math 12/25/2017, 11:50 AM

## 2017-12-25 NOTE — Lactation Note (Signed)
This note was copied from a baby's chart. Lactation Consultation Note  Patient Name: Wendy Potter WUJWJ'XToday's Date: 12/25/2017 Reason for consult: Follow-up assessment   P1, Baby 44 hours old. Mother hand expressed good flow of colostrum. She feels her breasts filling. Assisted w/ latching baby in football hold.  Sucks and swallows observed and heard. Encouraged breast compression and breastfeeding on both breasts per session. Mom encouraged to feed baby 8-12 times/24 hours and with feeding cues.  Reviewed engorgement care and monitoring voids/stools.    Maternal Data Has patient been taught Hand Expression?: Yes Does the patient have breastfeeding experience prior to this delivery?: No  Feeding Feeding Type: Breast Fed  LATCH Score Latch: Grasps breast easily, tongue down, lips flanged, rhythmical sucking.  Audible Swallowing: Spontaneous and intermittent  Type of Nipple: Everted at rest and after stimulation  Comfort (Breast/Nipple): Soft / non-tender  Hold (Positioning): Assistance needed to correctly position infant at breast and maintain latch.  LATCH Score: 9  Interventions Interventions: Breast feeding basics reviewed;Hand express  Lactation Tools Discussed/Used     Consult Status Consult Status: Complete    Hardie PulleyBerkelhammer, Ruth Boschen 12/25/2017, 2:12 PM

## 2018-08-05 ENCOUNTER — Emergency Department (HOSPITAL_COMMUNITY)
Admission: EM | Admit: 2018-08-05 | Discharge: 2018-08-05 | Disposition: A | Discharge status: Home / Self Care | Payer: Managed Care, Other (non HMO) | Attending: Emergency Medicine | Admitting: Emergency Medicine

## 2018-08-05 ENCOUNTER — Emergency Department (HOSPITAL_COMMUNITY): Payer: Managed Care, Other (non HMO)

## 2018-08-05 ENCOUNTER — Other Ambulatory Visit: Payer: Self-pay

## 2018-08-05 ENCOUNTER — Encounter (HOSPITAL_COMMUNITY): Payer: Self-pay

## 2018-08-05 DIAGNOSIS — R51 Headache: Secondary | ICD-10-CM | POA: Insufficient documentation

## 2018-08-05 DIAGNOSIS — Z79899 Other long term (current) drug therapy: Secondary | ICD-10-CM | POA: Diagnosis not present

## 2018-08-05 DIAGNOSIS — R519 Headache, unspecified: Secondary | ICD-10-CM

## 2018-08-05 DIAGNOSIS — F17210 Nicotine dependence, cigarettes, uncomplicated: Secondary | ICD-10-CM | POA: Insufficient documentation

## 2018-08-05 LAB — BASIC METABOLIC PANEL
Anion gap: 7 (ref 5–15)
BUN: 12 mg/dL (ref 6–20)
CO2: 21 mmol/L — ABNORMAL LOW (ref 22–32)
Calcium: 9 mg/dL (ref 8.9–10.3)
Chloride: 110 mmol/L (ref 98–111)
Creatinine, Ser: 0.72 mg/dL (ref 0.44–1.00)
GFR calc Af Amer: >60 mL/min (ref >60)
GFR calc non Af Amer: >60 mL/min (ref >60)
Glucose, Bld: 98 mg/dL (ref 70–99)
Potassium: 3.9 mmol/L (ref 3.5–5.1)
Sodium: 138 mmol/L (ref 135–145)

## 2018-08-05 LAB — CBC WITH DIFFERENTIAL/PLATELET
Abs Immature Granulocytes: 0 10*3/uL (ref 0.0–0.1)
Basophils Absolute: 0.1 10*3/uL (ref 0.0–0.1)
Basophils Relative: 1 %
Eosinophils Absolute: 0.2 10*3/uL (ref 0.0–0.7)
Eosinophils Relative: 1 %
HCT: 38.5 % (ref 36.0–46.0)
Hemoglobin: 12.7 g/dL (ref 12.0–15.0)
Immature Granulocytes: 0 %
Lymphocytes Relative: 20 %
Lymphs Abs: 2.2 10*3/uL (ref 0.7–4.0)
MCH: 30.8 pg (ref 26.0–34.0)
MCHC: 33 g/dL (ref 30.0–36.0)
MCV: 93.2 fL (ref 78.0–100.0)
Monocytes Absolute: 0.5 10*3/uL (ref 0.1–1.0)
Monocytes Relative: 5 %
Neutro Abs: 8 10*3/uL — ABNORMAL HIGH (ref 1.7–7.7)
Neutrophils Relative %: 73 %
Platelets: 160 10*3/uL (ref 150–400)
RBC: 4.13 MIL/uL (ref 3.87–5.11)
RDW: 12.6 % (ref 11.5–15.5)
WBC: 10.9 10*3/uL — ABNORMAL HIGH (ref 4.0–10.5)

## 2018-08-05 LAB — CBG MONITORING, ED: Glucose-Capillary: 88 mg/dL (ref 70–99)

## 2018-08-05 LAB — I-STAT BETA HCG BLOOD, ED (MC, WL, AP ONLY): I-stat hCG, quantitative: <5 m[IU]/mL (ref <5)

## 2018-08-05 MED ORDER — SODIUM CHLORIDE 0.9 % IV BOLUS
1000.0000 mL | Freq: Once | INTRAVENOUS | Status: AC
  Administered 2018-08-05: 1000 mL via INTRAVENOUS

## 2018-08-05 MED ORDER — METOCLOPRAMIDE HCL 5 MG/ML IJ SOLN
10.0000 mg | Freq: Once | INTRAMUSCULAR | Status: AC
  Administered 2018-08-05: 10 mg via INTRAVENOUS
  Filled 2018-08-05: qty 2

## 2018-08-05 MED ORDER — KETOROLAC TROMETHAMINE 15 MG/ML IJ SOLN
15.0000 mg | Freq: Once | INTRAMUSCULAR | Status: AC
  Administered 2018-08-05: 15 mg via INTRAVENOUS
  Filled 2018-08-05: qty 1

## 2018-08-05 MED ORDER — DIPHENHYDRAMINE HCL 50 MG/ML IJ SOLN
12.5000 mg | Freq: Once | INTRAMUSCULAR | Status: AC
  Administered 2018-08-05: 12.5 mg via INTRAVENOUS
  Filled 2018-08-05: qty 1
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
## 2018-08-05 NOTE — ED Notes (Signed)
ED Provider at bedside. 

## 2018-08-05 NOTE — ED Notes (Signed)
Patient transported to CT 

## 2018-08-05 NOTE — ED Triage Notes (Addendum)
Per [facility], pt arrives from home. Pt reports at 1630 she noticed a  Progressively worsening right sided headache, dizziness, and right sided blurry vision. Pt endorses HA initially 10/10. Pt received a total of 150 mcg of fentanyl. Pt has clear speech and symmetrical face. Pt reports HA now 3/10, still experiencing blurry vision. Pt is alert and oriented.

## 2018-12-12 DIAGNOSIS — B009 Herpesviral infection, unspecified: Secondary | ICD-10-CM | POA: Insufficient documentation

## 2018-12-20 DIAGNOSIS — R87619 Unspecified abnormal cytological findings in specimens from cervix uteri: Secondary | ICD-10-CM | POA: Insufficient documentation

## 2018-12-20 DIAGNOSIS — Z9189 Other specified personal risk factors, not elsewhere classified: Secondary | ICD-10-CM | POA: Insufficient documentation

## 2018-12-20 DIAGNOSIS — F17200 Nicotine dependence, unspecified, uncomplicated: Secondary | ICD-10-CM | POA: Insufficient documentation

## 2019-04-12 IMAGING — CT CT HEAD W/O CM
4 series · 15 of 47 positions shown, 17 images · non-contrast
Comparison: None.

CLINICAL DATA: Worsening right headache

EXAM:
CT HEAD WITHOUT CONTRAST
TECHNIQUE: Contiguous axial images were obtained from the base of the skull
through the vertex without intravenous contrast.

[Series 3: head without · axial · non-contrast · 0.46mm/px · z∈[-222,-102]mm · 7 of 34 slices shown, 9 images]
[im 5/34  brain]
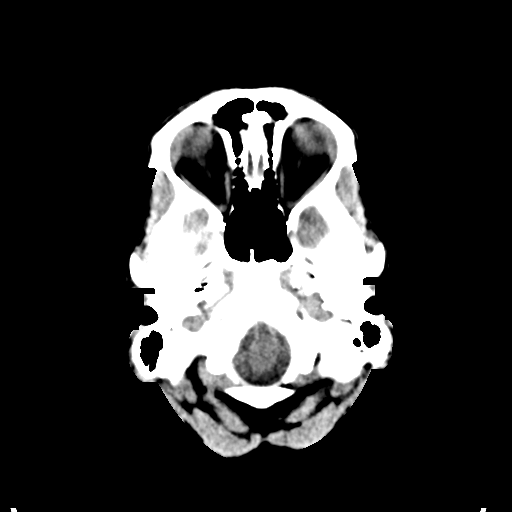
[im 5/34  bone]
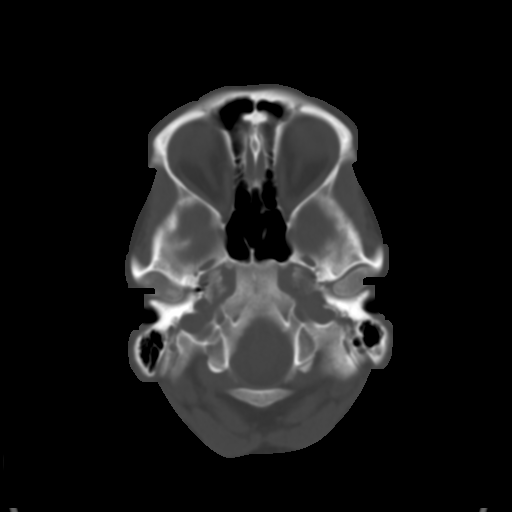
[im 9/34  brain]
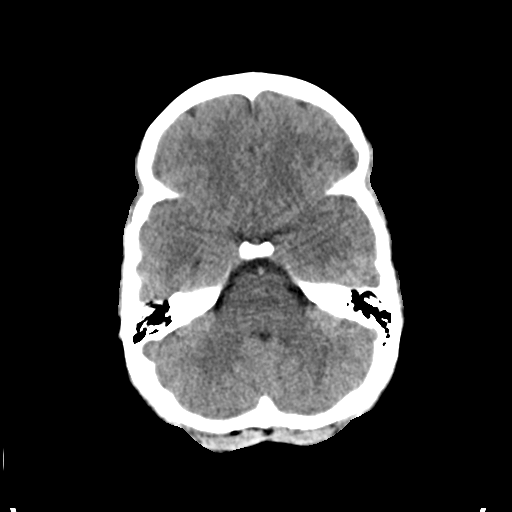
[im 13/34  brain]
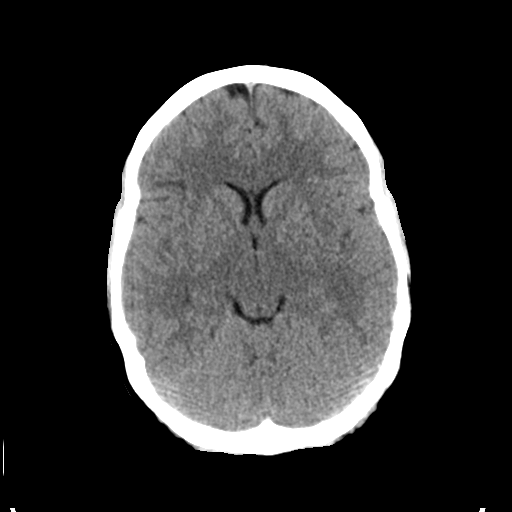
[im 17/34  brain]
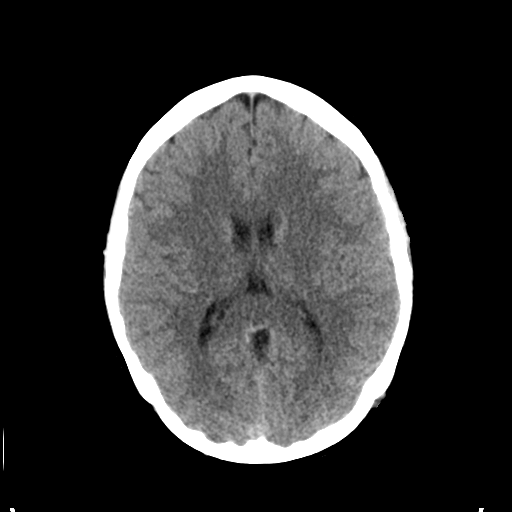
[im 21/34  brain]
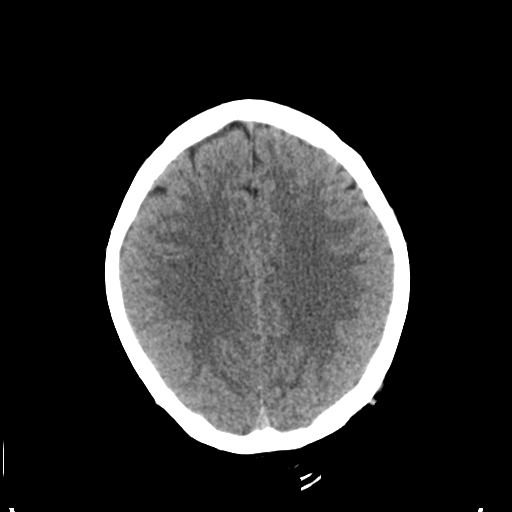
[im 21/34  bone]
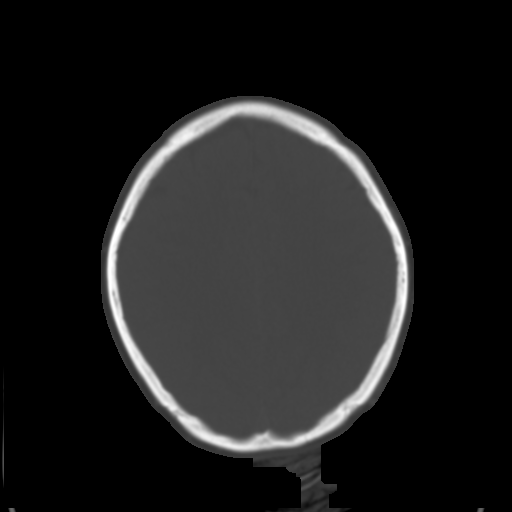
[im 25/34  brain]
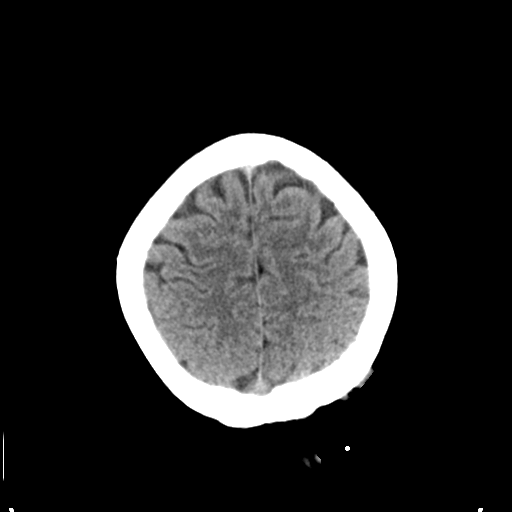
[im 29/34  brain]
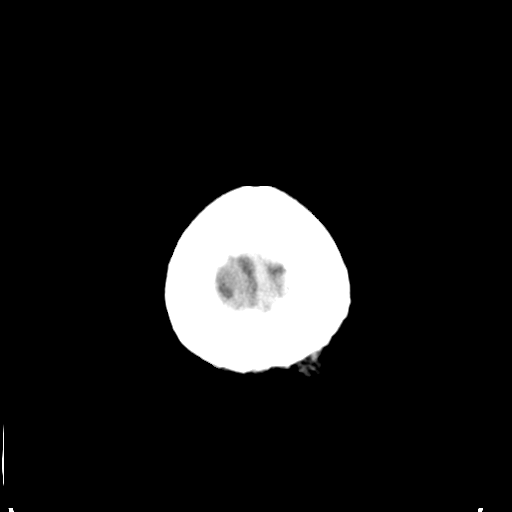

[Series 4: head bone · axial · 0.46mm/px · z∈[-226,-210]mm · 2 of 85 slices shown]
[im 9/85  bone]
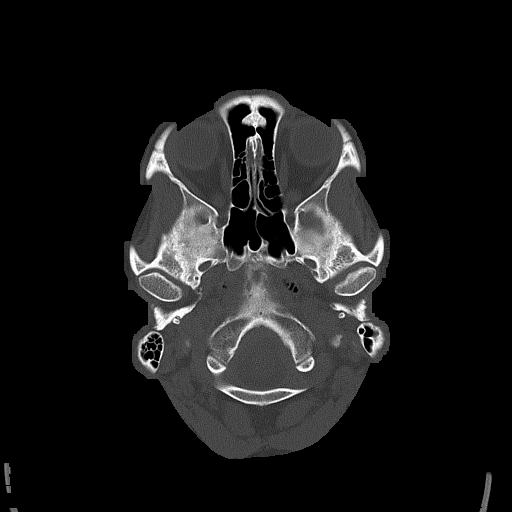
[im 17/85  bone]
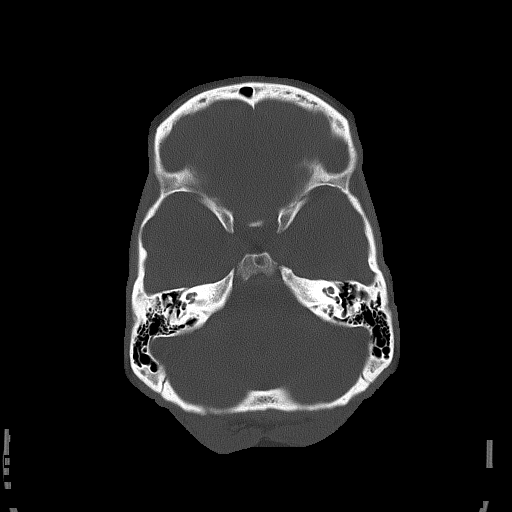

[Series 5: head without cor · coronal · non-contrast · 0.33mm/px · 3 of 74 slices shown]
[im 25/74  brain]
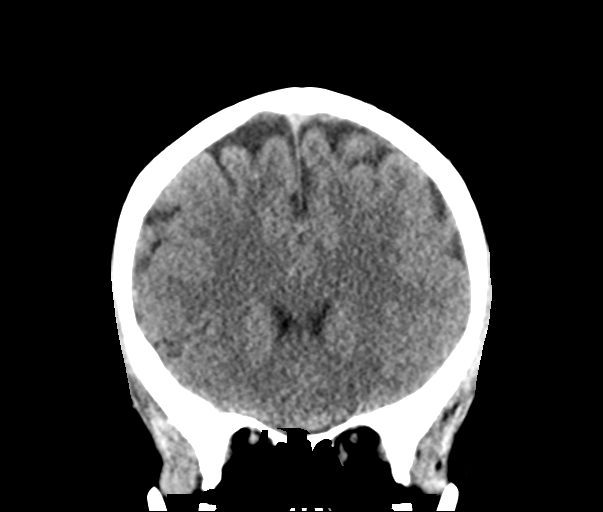
[im 33/74  brain]
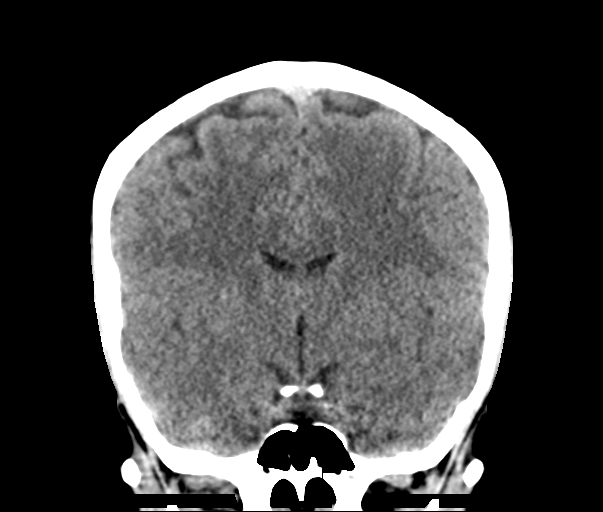
[im 41/74  brain]
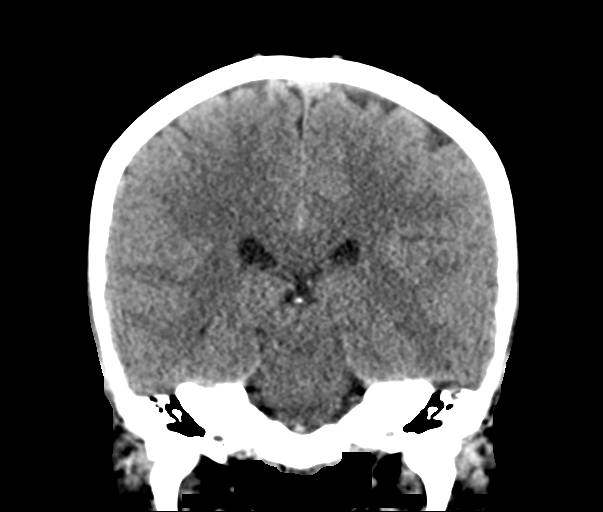

[Series 6: head without sag · sagittal · non-contrast · 0.33mm/px · 3 of 67 slices shown]
[im 23/67  brain]
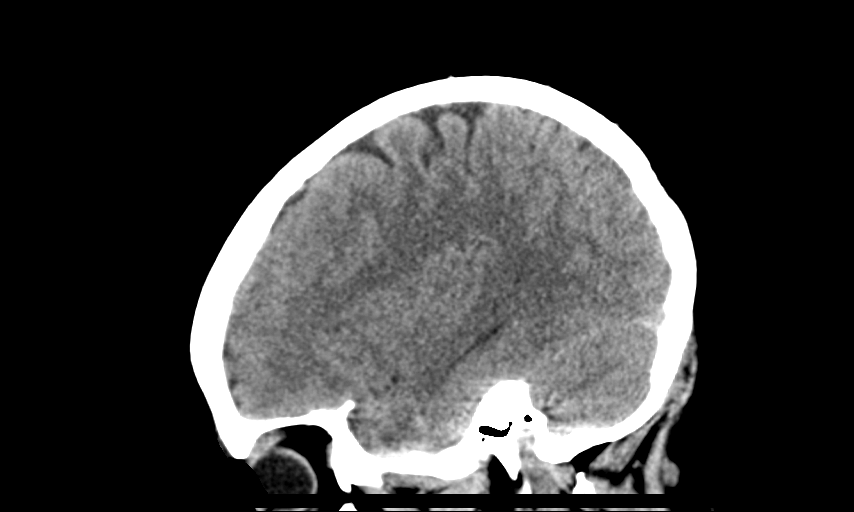
[im 34/67  brain]
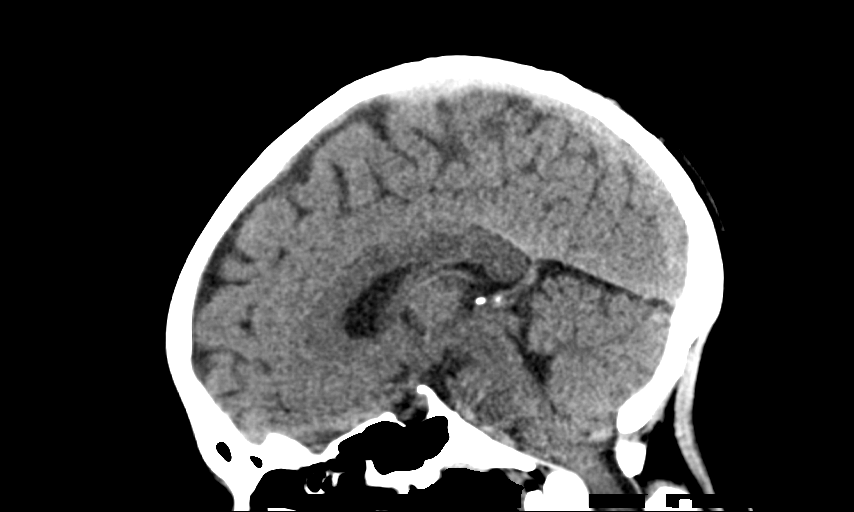
[im 45/67  brain]
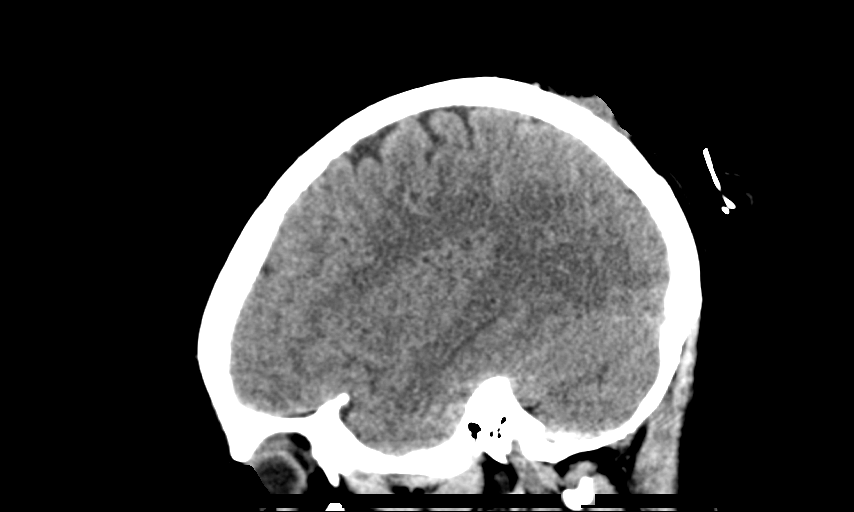

[15 of 47 positions shown; findings below may reference images not displayed]

FINDINGS: Brain: No acute territorial infarction, hemorrhage or intracranial
mass. Ventricles are nonenlarged.

Vascular: No hyperdense vessel or unexpected calcification.

Skull: Normal. Negative for fracture or focal lesion.

Sinuses/Orbits: No acute finding.

Other: Left posterior parietal scalp lesion
IMPRESSION: 1. Negative non contrasted CT appearance of the brain
2. Left posterior parietal scalp lesion, correlate with physical
exam

## 2020-08-28 ENCOUNTER — Other Ambulatory Visit: Payer: Self-pay

## 2020-08-28 ENCOUNTER — Ambulatory Visit
Admission: EM | Admit: 2020-08-28 | Discharge: 2020-08-28 | Disposition: A | Discharge status: Home / Self Care | Payer: BC Managed Care – PPO | Attending: Physician Assistant | Admitting: Physician Assistant

## 2020-08-28 DIAGNOSIS — R3989 Other symptoms and signs involving the genitourinary system: Secondary | ICD-10-CM

## 2020-08-28 LAB — POCT URINALYSIS DIP (MANUAL ENTRY)
Bilirubin, UA: NEGATIVE
Glucose, UA: NEGATIVE mg/dL
Ketones, POC UA: NEGATIVE mg/dL
Leukocytes, UA: NEGATIVE
Nitrite, UA: NEGATIVE
Protein Ur, POC: NEGATIVE mg/dL
Spec Grav, UA: 1.015 (ref 1.010–1.025)
Urobilinogen, UA: 0.2 E.U./dL
pH, UA: 8.5 — AB (ref 5.0–8.0)

## 2020-08-28 LAB — POCT URINE PREGNANCY: Preg Test, Ur: NEGATIVE

## 2020-08-28 MED ORDER — KETOROLAC TROMETHAMINE 10 MG PO TABS: 10.0000 mg | ORAL_TABLET | Freq: Four times a day (QID) | ORAL | 0 refills | Status: DC | PRN

## 2020-08-28 MED ORDER — TAMSULOSIN HCL 0.4 MG PO CAPS: 0.4000 mg | ORAL_CAPSULE | Freq: Every day | ORAL | 0 refills | Status: DC

## 2020-08-28 NOTE — ED Triage Notes (Signed)
Patient in with c/o vaginal discomfort and pain that started yesterday. Also c/o urinary frequency and 2 episodes of incontinence.  Has not had any medication for sxs  Denies fever, vomiting, diarrhea, vaginal odor, discharge or irritation

## 2020-08-28 NOTE — Discharge Instructions (Addendum)
As discussed, this is likely due to kidney stone. Toradol as needed for pain. Can also trial over the counter AZO. Flomax as directed. Keep hydrated, urine should be clear to pale yellow in color. Follow up with GYN for further evaluation if incontinence does not resolve.
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
## 2021-05-05 LAB — HEPATITIS C ANTIBODY: HCV Ab: NEGATIVE

## 2021-05-05 LAB — OB RESULTS CONSOLE HIV ANTIBODY (ROUTINE TESTING): HIV: NONREACTIVE

## 2021-05-05 LAB — OB RESULTS CONSOLE RUBELLA ANTIBODY, IGM: Rubella: IMMUNE

## 2021-05-05 LAB — OB RESULTS CONSOLE ANTIBODY SCREEN: Antibody Screen: NEGATIVE

## 2021-05-05 LAB — OB RESULTS CONSOLE GC/CHLAMYDIA
Chlamydia: NEGATIVE
Gonorrhea: NEGATIVE

## 2021-05-05 LAB — OB RESULTS CONSOLE RPR: RPR: NONREACTIVE

## 2021-05-05 LAB — OB RESULTS CONSOLE HEPATITIS B SURFACE ANTIGEN: Hepatitis B Surface Ag: NEGATIVE

## 2021-05-05 LAB — OB RESULTS CONSOLE ABO/RH: RH Type: POSITIVE

## 2021-11-17 LAB — OB RESULTS CONSOLE GBS: GBS: NEGATIVE

## 2021-11-26 ENCOUNTER — Encounter (HOSPITAL_COMMUNITY): Payer: Self-pay

## 2021-11-26 ENCOUNTER — Encounter (HOSPITAL_COMMUNITY): Payer: Self-pay | Admitting: *Deleted

## 2021-11-26 NOTE — Patient Instructions (Addendum)
Wendy Potter  11/26/2021   Your procedure is scheduled on:  12/10/2021  Arrive at 0530 at Entrance C on [address] at [facility]  and [employer]. You are invited to use the FREE valet parking or use the Visitor's parking deck.  Pick up the phone at the desk and dial [phone].  Call this number if you have problems the morning of surgery: [phone]  Remember:   Do not eat food:(After Midnight) Desps de medianoche.  Do not drink clear liquids: (After Midnight) Desps de medianoche.  Take these medicines the morning of surgery with A SIP OF WATER:  Take valtrex as prescribed   Do not wear jewelry, make-up or nail polish.  Do not wear lotions, powders, or perfumes. Do not wear deodorant.  Do not shave 48 hours prior to surgery.  Do not bring valuables to the hospital.  [facility] is not   responsible for any belongings or valuables brought to the hospital.  Contacts, dentures or bridgework may not be worn into surgery.  Leave suitcase in the car. After surgery it may be brought to your room.  For patients admitted to the hospital, checkout time is 11:00 AM the day of              discharge.      Please read over the following fact sheets that you were given:     Preparing for Surgery
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
## 2021-12-08 ENCOUNTER — Other Ambulatory Visit: Payer: Self-pay | Admitting: Obstetrics and Gynecology

## 2021-12-08 ENCOUNTER — Encounter (HOSPITAL_COMMUNITY): Payer: Self-pay | Admitting: Obstetrics and Gynecology

## 2021-12-08 ENCOUNTER — Other Ambulatory Visit (HOSPITAL_COMMUNITY)
Admission: RE | Admit: 2021-12-08 | Discharge: 2021-12-08 | Disposition: A | Discharge status: Home / Self Care | Payer: BC Managed Care – PPO | Source: Ambulatory Visit | Attending: Obstetrics and Gynecology | Admitting: Obstetrics and Gynecology

## 2021-12-08 ENCOUNTER — Other Ambulatory Visit: Payer: Self-pay

## 2021-12-08 DIAGNOSIS — Z01812 Encounter for preprocedural laboratory examination: Secondary | ICD-10-CM | POA: Insufficient documentation

## 2021-12-08 LAB — CBC
HCT: 38.2 % (ref 36.0–46.0)
Hemoglobin: 12.8 g/dL (ref 12.0–15.0)
MCH: 31.5 pg (ref 26.0–34.0)
MCHC: 33.5 g/dL (ref 30.0–36.0)
MCV: 94.1 fL (ref 80.0–100.0)
Platelets: 191 10*3/uL (ref 150–400)
RBC: 4.06 MIL/uL (ref 3.87–5.11)
RDW: 14.2 % (ref 11.5–15.5)
WBC: 10 10*3/uL (ref 4.0–10.5)
nRBC: 0 % (ref 0.0–0.2)

## 2021-12-08 LAB — TYPE AND SCREEN
ABO/RH(D): O POS
Antibody Screen: NEGATIVE

## 2021-12-08 LAB — RPR: RPR Ser Ql: NONREACTIVE

## 2021-12-09 LAB — SARS CORONAVIRUS 2 (TAT 6-24 HRS): SARS Coronavirus 2: NEGATIVE

## 2021-12-09 NOTE — Anesthesia Preprocedure Evaluation (Addendum)
Anesthesia Evaluation  Patient identified by MRN, date of birth, ID band Patient awake    Reviewed: Allergy & Precautions, NPO status , Patient's Chart, lab work & pertinent test results  Airway Mallampati: II  TM Distance: >3 FB Neck ROM: Full    Dental  (+) Teeth Intact, Dental Advisory Given   Pulmonary former smoker,  Quit smoking 07/2021   Pulmonary exam normal breath sounds clear to auscultation       Cardiovascular negative cardio ROS Normal cardiovascular exam Rhythm:Regular Rate:Normal     Neuro/Psych PSYCHIATRIC DISORDERS Anxiety Depression negative neurological ROS     GI/Hepatic Neg liver ROS, GERD  Controlled,  Endo/Other  negative endocrine ROS  Renal/GU negative Renal ROS  negative genitourinary   Musculoskeletal negative musculoskeletal ROS (+)   Abdominal   Peds  Hematology negative hematology ROS (+) hct 38.2, plt 191   Anesthesia Other Findings   Reproductive/Obstetrics (+) Pregnancy Repeat C section, 1 prior 2019                            Anesthesia Physical Anesthesia Plan  ASA: 2  Anesthesia Plan: Spinal   Post-op Pain Management: Regional block, Toradol IV (intra-op) and Ofirmev IV (intra-op)   Induction:   PONV Risk Score and Plan: 3 and Ondansetron, Dexamethasone and Treatment may vary due to age or medical condition  Airway Management Planned: Natural Airway and Nasal Cannula  Additional Equipment: None  Intra-op Plan:   Post-operative Plan:   Informed Consent: I have reviewed the patients History and Physical, chart, labs and discussed the procedure including the risks, benefits and alternatives for the proposed anesthesia with the patient or authorized representative who has indicated his/her understanding and acceptance.     Dental advisory given  Plan Discussed with: CRNA  Anesthesia Plan Comments:        Anesthesia Quick  Evaluation

## 2021-12-10 ENCOUNTER — Other Ambulatory Visit: Payer: Self-pay

## 2021-12-10 ENCOUNTER — Encounter (HOSPITAL_COMMUNITY)
Admission: RE | Disposition: A | Discharge status: Home / Self Care | Payer: Self-pay | Source: Home / Self Care | Attending: Obstetrics and Gynecology

## 2021-12-10 ENCOUNTER — Inpatient Hospital Stay (HOSPITAL_COMMUNITY): Payer: BC Managed Care – PPO | Admitting: Anesthesiology

## 2021-12-10 ENCOUNTER — Inpatient Hospital Stay (HOSPITAL_COMMUNITY)
Admission: RE | Admit: 2021-12-10 | Discharge: 2021-12-12 | DRG: 784 | Disposition: A | Discharge status: Home / Self Care | Payer: BC Managed Care – PPO | Attending: Obstetrics and Gynecology | Admitting: Obstetrics and Gynecology

## 2021-12-10 ENCOUNTER — Encounter (HOSPITAL_COMMUNITY): Payer: Self-pay | Admitting: Obstetrics and Gynecology

## 2021-12-10 DIAGNOSIS — O9832 Other infections with a predominantly sexual mode of transmission complicating childbirth: Secondary | ICD-10-CM | POA: Diagnosis present

## 2021-12-10 DIAGNOSIS — O34211 Maternal care for low transverse scar from previous cesarean delivery: Principal | ICD-10-CM | POA: Diagnosis present

## 2021-12-10 DIAGNOSIS — Z87891 Personal history of nicotine dependence: Secondary | ICD-10-CM

## 2021-12-10 DIAGNOSIS — Z302 Encounter for sterilization: Secondary | ICD-10-CM

## 2021-12-10 DIAGNOSIS — Z3A39 39 weeks gestation of pregnancy: Secondary | ICD-10-CM

## 2021-12-10 DIAGNOSIS — Z3490 Encounter for supervision of normal pregnancy, unspecified, unspecified trimester: Secondary | ICD-10-CM

## 2021-12-10 DIAGNOSIS — A6000 Herpesviral infection of urogenital system, unspecified: Secondary | ICD-10-CM | POA: Diagnosis present

## 2021-12-10 HISTORY — DX: Unspecified abnormal cytological findings in specimens from vagina: R87.629

## 2021-12-10 SURGERY — Surgical Case
Anesthesia: Spinal | Site: Abdomen | Wound class: Clean Contaminated

## 2021-12-10 MED ORDER — DEXAMETHASONE SODIUM PHOSPHATE 4 MG/ML IJ SOLN
INTRAMUSCULAR | Status: DC | PRN
  Administered 2021-12-10: 8 mg via INTRAVENOUS

## 2021-12-10 MED ORDER — OXYTOCIN-SODIUM CHLORIDE 30-0.9 UT/500ML-% IV SOLN
INTRAVENOUS | Status: AC
  Filled 2021-12-10: qty 500

## 2021-12-10 MED ORDER — IBUPROFEN 600 MG PO TABS
600.0000 mg | ORAL_TABLET | Freq: Four times a day (QID) | ORAL | Status: DC
  Administered 2021-12-11 – 2021-12-12 (×5): 600 mg via ORAL
  Filled 2021-12-10 (×4): qty 1

## 2021-12-10 MED ORDER — CEFAZOLIN SODIUM-DEXTROSE 2-3 GM-%(50ML) IV SOLR
INTRAVENOUS | Status: DC | PRN
  Administered 2021-12-10: 2 g via INTRAVENOUS

## 2021-12-10 MED ORDER — HYDROMORPHONE HCL 1 MG/ML IJ SOLN: 0.2000 mg | INTRAMUSCULAR | Status: DC | PRN

## 2021-12-10 MED ORDER — SOD CITRATE-CITRIC ACID 500-334 MG/5ML PO SOLN
ORAL | Status: AC
  Filled 2021-12-10: qty 30

## 2021-12-10 MED ORDER — SCOPOLAMINE 1 MG/3DAYS TD PT72: 1.0000 | MEDICATED_PATCH | Freq: Once | TRANSDERMAL | Status: DC

## 2021-12-10 MED ORDER — ONDANSETRON HCL 4 MG/2ML IJ SOLN
INTRAMUSCULAR | Status: DC | PRN
  Administered 2021-12-10: 4 mg via INTRAVENOUS

## 2021-12-10 MED ORDER — STERILE WATER FOR IRRIGATION IR SOLN
Status: DC | PRN
  Administered 2021-12-10: 1000 mL

## 2021-12-10 MED ORDER — KETOROLAC TROMETHAMINE 30 MG/ML IJ SOLN: 30.0000 mg | Freq: Once | INTRAMUSCULAR | Status: DC | PRN

## 2021-12-10 MED ORDER — TETANUS-DIPHTH-ACELL PERTUSSIS 5-2.5-18.5 LF-MCG/0.5 IM SUSY: 0.5000 mL | PREFILLED_SYRINGE | Freq: Once | INTRAMUSCULAR | Status: DC

## 2021-12-10 MED ORDER — LACTATED RINGERS IV SOLN: INTRAVENOUS | Status: DC

## 2021-12-10 MED ORDER — ACETAMINOPHEN 500 MG PO TABS: 1000.0000 mg | ORAL_TABLET | Freq: Four times a day (QID) | ORAL | Status: DC

## 2021-12-10 MED ORDER — KETOROLAC TROMETHAMINE 30 MG/ML IJ SOLN
INTRAMUSCULAR | Status: AC
  Filled 2021-12-10: qty 1

## 2021-12-10 MED ORDER — PHENYLEPHRINE HCL-NACL 20-0.9 MG/250ML-% IV SOLN
INTRAVENOUS | Status: AC
  Filled 2021-12-10: qty 250

## 2021-12-10 MED ORDER — OXYTOCIN-SODIUM CHLORIDE 30-0.9 UT/500ML-% IV SOLN
INTRAVENOUS | Status: DC | PRN
  Administered 2021-12-10: 300 mL via INTRAVENOUS

## 2021-12-10 MED ORDER — DIBUCAINE (PERIANAL) 1 % EX OINT: 1.0000 "application " | TOPICAL_OINTMENT | CUTANEOUS | Status: DC | PRN

## 2021-12-10 MED ORDER — PHENYLEPHRINE HCL-NACL 20-0.9 MG/250ML-% IV SOLN
INTRAVENOUS | Status: DC | PRN
  Administered 2021-12-10: 60 ug/min via INTRAVENOUS

## 2021-12-10 MED ORDER — HYDROMORPHONE HCL 1 MG/ML IJ SOLN: 0.2500 mg | INTRAMUSCULAR | Status: DC | PRN

## 2021-12-10 MED ORDER — CEFAZOLIN SODIUM-DEXTROSE 2-4 GM/100ML-% IV SOLN: 2.0000 g | INTRAVENOUS | Status: DC

## 2021-12-10 MED ORDER — KETOROLAC TROMETHAMINE 30 MG/ML IJ SOLN
30.0000 mg | Freq: Four times a day (QID) | INTRAMUSCULAR | Status: AC
  Administered 2021-12-10 – 2021-12-11 (×3): 30 mg via INTRAVENOUS
  Filled 2021-12-10 (×3): qty 1

## 2021-12-10 MED ORDER — VALACYCLOVIR HCL 500 MG PO TABS
500.0000 mg | ORAL_TABLET | Freq: Two times a day (BID) | ORAL | Status: DC
  Administered 2021-12-10 – 2021-12-11 (×3): 500 mg via ORAL
  Filled 2021-12-10 (×4): qty 1

## 2021-12-10 MED ORDER — DEXAMETHASONE SODIUM PHOSPHATE 4 MG/ML IJ SOLN
INTRAMUSCULAR | Status: AC
  Filled 2021-12-10: qty 2

## 2021-12-10 MED ORDER — SODIUM CHLORIDE 0.9 % IR SOLN
Status: DC | PRN
  Administered 2021-12-10: 1000 mL

## 2021-12-10 MED ORDER — OXYTOCIN-SODIUM CHLORIDE 30-0.9 UT/500ML-% IV SOLN: 2.5000 [IU]/h | INTRAVENOUS | Status: AC

## 2021-12-10 MED ORDER — DIPHENHYDRAMINE HCL 25 MG PO CAPS: 25.0000 mg | ORAL_CAPSULE | Freq: Four times a day (QID) | ORAL | Status: DC | PRN

## 2021-12-10 MED ORDER — MENTHOL 3 MG MT LOZG: 1.0000 | LOZENGE | OROMUCOSAL | Status: DC | PRN

## 2021-12-10 MED ORDER — ZOLPIDEM TARTRATE 5 MG PO TABS: 5.0000 mg | ORAL_TABLET | Freq: Every evening | ORAL | Status: DC | PRN

## 2021-12-10 MED ORDER — SODIUM CHLORIDE 0.9% FLUSH: 3.0000 mL | INTRAVENOUS | Status: DC | PRN

## 2021-12-10 MED ORDER — WITCH HAZEL-GLYCERIN EX PADS: 1.0000 "application " | MEDICATED_PAD | CUTANEOUS | Status: DC | PRN

## 2021-12-10 MED ORDER — SIMETHICONE 80 MG PO CHEW
80.0000 mg | CHEWABLE_TABLET | Freq: Three times a day (TID) | ORAL | Status: DC
  Administered 2021-12-10 – 2021-12-12 (×6): 80 mg via ORAL
  Filled 2021-12-10 (×6): qty 1

## 2021-12-10 MED ORDER — MEPERIDINE HCL 25 MG/ML IJ SOLN: 6.2500 mg | INTRAMUSCULAR | Status: DC | PRN

## 2021-12-10 MED ORDER — PRENATAL MULTIVITAMIN CH
1.0000 | ORAL_TABLET | Freq: Every day | ORAL | Status: DC
  Administered 2021-12-10 – 2021-12-12 (×3): 1 via ORAL
  Filled 2021-12-10 (×2): qty 1

## 2021-12-10 MED ORDER — ACETAMINOPHEN 10 MG/ML IV SOLN
INTRAVENOUS | Status: DC | PRN
  Administered 2021-12-10: 1000 mg via INTRAVENOUS

## 2021-12-10 MED ORDER — DIPHENHYDRAMINE HCL 50 MG/ML IJ SOLN: 12.5000 mg | INTRAMUSCULAR | Status: DC | PRN

## 2021-12-10 MED ORDER — SOD CITRATE-CITRIC ACID 500-334 MG/5ML PO SOLN
30.0000 mL | ORAL | Status: AC
  Administered 2021-12-10: 30 mL via ORAL

## 2021-12-10 MED ORDER — OXYCODONE HCL 5 MG PO TABS
5.0000 mg | ORAL_TABLET | ORAL | Status: DC | PRN
  Administered 2021-12-11 – 2021-12-12 (×2): 5 mg via ORAL
  Filled 2021-12-10 (×3): qty 1

## 2021-12-10 MED ORDER — BUPIVACAINE IN DEXTROSE 0.75-8.25 % IT SOLN
INTRATHECAL | Status: DC | PRN
  Administered 2021-12-10: 1.5 mL via INTRATHECAL

## 2021-12-10 MED ORDER — NALOXONE HCL 0.4 MG/ML IJ SOLN: 0.4000 mg | INTRAMUSCULAR | Status: DC | PRN

## 2021-12-10 MED ORDER — SIMETHICONE 80 MG PO CHEW: 80.0000 mg | CHEWABLE_TABLET | ORAL | Status: DC | PRN

## 2021-12-10 MED ORDER — PHENYLEPHRINE 40 MCG/ML (10ML) SYRINGE FOR IV PUSH (FOR BLOOD PRESSURE SUPPORT)
PREFILLED_SYRINGE | INTRAVENOUS | Status: AC
  Filled 2021-12-10: qty 10

## 2021-12-10 MED ORDER — FENTANYL CITRATE (PF) 100 MCG/2ML IJ SOLN
INTRAMUSCULAR | Status: AC
  Filled 2021-12-10: qty 2

## 2021-12-10 MED ORDER — METOCLOPRAMIDE HCL 5 MG/ML IJ SOLN
INTRAMUSCULAR | Status: AC
  Filled 2021-12-10: qty 2

## 2021-12-10 MED ORDER — MORPHINE SULFATE (PF) 0.5 MG/ML IJ SOLN
INTRAMUSCULAR | Status: DC | PRN
  Administered 2021-12-10: 150 ug via INTRATHECAL

## 2021-12-10 MED ORDER — DIPHENHYDRAMINE HCL 25 MG PO CAPS: 25.0000 mg | ORAL_CAPSULE | ORAL | Status: DC | PRN

## 2021-12-10 MED ORDER — FENTANYL CITRATE (PF) 100 MCG/2ML IJ SOLN
INTRAMUSCULAR | Status: DC | PRN
  Administered 2021-12-10: 15 ug via INTRATHECAL

## 2021-12-10 MED ORDER — METOCLOPRAMIDE HCL 5 MG/ML IJ SOLN
INTRAMUSCULAR | Status: DC | PRN
  Administered 2021-12-10: 10 mg via INTRAVENOUS

## 2021-12-10 MED ORDER — KETOROLAC TROMETHAMINE 30 MG/ML IJ SOLN
30.0000 mg | Freq: Four times a day (QID) | INTRAMUSCULAR | Status: AC | PRN
  Administered 2021-12-10: 30 mg via INTRAVENOUS

## 2021-12-10 MED ORDER — ONDANSETRON HCL 4 MG/2ML IJ SOLN: 4.0000 mg | Freq: Three times a day (TID) | INTRAMUSCULAR | Status: DC | PRN

## 2021-12-10 MED ORDER — KETOROLAC TROMETHAMINE 30 MG/ML IJ SOLN: 30.0000 mg | Freq: Four times a day (QID) | INTRAMUSCULAR | Status: AC | PRN

## 2021-12-10 MED ORDER — OXYCODONE HCL 5 MG/5ML PO SOLN: 5.0000 mg | Freq: Once | ORAL | Status: DC | PRN

## 2021-12-10 MED ORDER — NALOXONE HCL 4 MG/10ML IJ SOLN
1.0000 ug/kg/h | INTRAVENOUS | Status: DC | PRN
  Filled 2021-12-10: qty 5

## 2021-12-10 MED ORDER — ACETAMINOPHEN 10 MG/ML IV SOLN
INTRAVENOUS | Status: AC
  Filled 2021-12-10: qty 100

## 2021-12-10 MED ORDER — PROMETHAZINE HCL 25 MG/ML IJ SOLN: 6.2500 mg | INTRAMUSCULAR | Status: DC | PRN

## 2021-12-10 MED ORDER — ACETAMINOPHEN 500 MG PO TABS
1000.0000 mg | ORAL_TABLET | Freq: Four times a day (QID) | ORAL | Status: DC
  Administered 2021-12-10 – 2021-12-12 (×7): 1000 mg via ORAL
  Filled 2021-12-10 (×7): qty 2

## 2021-12-10 MED ORDER — MORPHINE SULFATE (PF) 0.5 MG/ML IJ SOLN
INTRAMUSCULAR | Status: AC
  Filled 2021-12-10: qty 10

## 2021-12-10 MED ORDER — OXYCODONE HCL 5 MG PO TABS: 5.0000 mg | ORAL_TABLET | Freq: Once | ORAL | Status: DC | PRN

## 2021-12-10 MED ORDER — SENNOSIDES-DOCUSATE SODIUM 8.6-50 MG PO TABS
2.0000 | ORAL_TABLET | Freq: Every day | ORAL | Status: DC
  Administered 2021-12-11 – 2021-12-12 (×2): 2 via ORAL
  Filled 2021-12-10 (×2): qty 2

## 2021-12-10 MED ORDER — LACTATED RINGERS IV SOLN: INTRAVENOUS | Status: DC | PRN

## 2021-12-10 MED ORDER — ONDANSETRON HCL 4 MG/2ML IJ SOLN
INTRAMUSCULAR | Status: AC
  Filled 2021-12-10: qty 2

## 2021-12-10 MED ORDER — CEFAZOLIN SODIUM-DEXTROSE 2-4 GM/100ML-% IV SOLN
INTRAVENOUS | Status: AC
  Filled 2021-12-10: qty 100

## 2021-12-10 MED ORDER — COCONUT OIL OIL
1.0000 "application " | TOPICAL_OIL | Status: DC | PRN
  Administered 2021-12-10: 1 via TOPICAL

## 2021-12-10 SURGICAL SUPPLY — 39 items
BENZOIN TINCTURE PRP APPL 2/3 (GAUZE/BANDAGES/DRESSINGS) ×1 IMPLANT
CHLORAPREP W/TINT 26ML (MISCELLANEOUS) ×2 IMPLANT
CLAMP CORD UMBIL (MISCELLANEOUS) IMPLANT
CLOSURE STERI STRIP 1/2 X4 (GAUZE/BANDAGES/DRESSINGS) ×1 IMPLANT
CLOTH BEACON ORANGE TIMEOUT ST (SAFETY) ×2 IMPLANT
DERMABOND ADVANCED (GAUZE/BANDAGES/DRESSINGS) ×1
DERMABOND ADVANCED .7 DNX12 (GAUZE/BANDAGES/DRESSINGS) ×1 IMPLANT
DRSG OPSITE POSTOP 4X10 (GAUZE/BANDAGES/DRESSINGS) ×2 IMPLANT
DRSG PAD ABDOMINAL 8X10 ST (GAUZE/BANDAGES/DRESSINGS) ×1 IMPLANT
ELECT REM PT RETURN 9FT ADLT (ELECTROSURGICAL) ×2
ELECTRODE REM PT RTRN 9FT ADLT (ELECTROSURGICAL) ×1 IMPLANT
EXTRACTOR VACUUM KIWI (MISCELLANEOUS) IMPLANT
GAUZE SPONGE 4X4 12PLY STRL LF (GAUZE/BANDAGES/DRESSINGS) ×2 IMPLANT
GLOVE BIO SURGEON STRL SZ 6.5 (GLOVE) ×2 IMPLANT
GLOVE BIOGEL PI IND STRL 6.5 (GLOVE) ×1 IMPLANT
GLOVE BIOGEL PI IND STRL 7.0 (GLOVE) ×2 IMPLANT
GLOVE BIOGEL PI INDICATOR 6.5 (GLOVE) ×1
GLOVE BIOGEL PI INDICATOR 7.0 (GLOVE) ×2
GOWN STRL REUS W/TWL LRG LVL3 (GOWN DISPOSABLE) ×4 IMPLANT
KIT ABG SYR 3ML LUER SLIP (SYRINGE) ×2 IMPLANT
NDL HYPO 25X5/8 SAFETYGLIDE (NEEDLE) ×1 IMPLANT
NEEDLE HYPO 25X5/8 SAFETYGLIDE (NEEDLE) ×2 IMPLANT
NS IRRIG 1000ML POUR BTL (IV SOLUTION) ×2 IMPLANT
PACK C SECTION WH (CUSTOM PROCEDURE TRAY) ×2 IMPLANT
PAD OB MATERNITY 4.3X12.25 (PERSONAL CARE ITEMS) ×2 IMPLANT
PENCIL SMOKE EVAC W/HOLSTER (ELECTROSURGICAL) ×2 IMPLANT
STRIP CLOSURE SKIN 1/2X4 (GAUZE/BANDAGES/DRESSINGS) ×1 IMPLANT
SUT PLAIN 0 NONE (SUTURE) IMPLANT
SUT PLAIN 2 0 (SUTURE) ×1
SUT PLAIN ABS 2-0 CT1 27XMFL (SUTURE) ×1 IMPLANT
SUT VIC AB 0 CT1 27 (SUTURE) ×1
SUT VIC AB 0 CT1 27XBRD ANBCTR (SUTURE) IMPLANT
SUT VIC AB 0 CT1 36 (SUTURE) ×2 IMPLANT
SUT VIC AB 0 CTX 36 (SUTURE) ×2
SUT VIC AB 0 CTX36XBRD ANBCTRL (SUTURE) ×2 IMPLANT
SUT VIC AB 4-0 PS2 27 (SUTURE) ×2 IMPLANT
TOWEL OR 17X24 6PK STRL BLUE (TOWEL DISPOSABLE) ×2 IMPLANT
TRAY FOLEY W/BAG SLVR 14FR LF (SET/KITS/TRAYS/PACK) IMPLANT
WATER STERILE IRR 1000ML POUR (IV SOLUTION) ×2 IMPLANT

## 2021-12-10 NOTE — Transfer of Care (Signed)
Immediate Anesthesia Transfer of Care Note  Patient: Wendy Potter  Procedure(s) Performed: REPEAT CESAREAN SECTION WITH BILATERAL TUBAL LIGATION (Abdomen)  Patient Location: PACU  Anesthesia Type:Spinal  Level of Consciousness: awake, alert  and oriented  Airway & Oxygen Therapy: Patient Spontanous Breathing  Post-op Assessment: Report given to RN and Post -op Vital signs reviewed and stable  Post vital signs: Reviewed and stable  Last Vitals:  Vitals Value Taken Time  BP    Temp    Pulse 69 12/10/21 0846  Resp    SpO2 97 % 12/10/21 0846  Vitals shown include unvalidated device data.  Last Pain:  Vitals:   12/10/21 0553  TempSrc: Oral         Complications: No notable events documented.

## 2021-12-10 NOTE — Anesthesia Procedure Notes (Signed)
Spinal  Patient location during procedure: OR Start time: 12/10/2021 7:20 AM End time: 12/10/2021 7:27 AM Reason for block: surgical anesthesia Staffing Performed: anesthesiologist  Anesthesiologist: Pervis Hocking, DO Preanesthetic Checklist Completed: patient identified, IV checked, risks and benefits discussed, surgical consent, monitors and equipment checked, pre-op evaluation and timeout performed Spinal Block Patient position: sitting Prep: DuraPrep and site prepped and draped Patient monitoring: cardiac monitor, continuous pulse ox and blood pressure Approach: midline Location: L3-4 Injection technique: single-shot Needle Needle type: Pencan  Needle gauge: 24 G Needle length: 9 cm Assessment Sensory level: T6 Events: CSF return and second provider Additional Notes Functioning IV was confirmed and monitors were applied. Sterile prep and drape, including hand hygiene and sterile gloves were used. The patient was positioned and the spine was prepped. The skin was anesthetized with lidocaine.  Free flow of clear CSF was obtained prior to injecting local anesthetic into the CSF.  The spinal needle aspirated freely following injection.  The needle was carefully withdrawn.  The patient tolerated the procedure well.   1st attempt sRNA unsuccessful

## 2021-12-10 NOTE — Op Note (Signed)
PROCEDURE DATE: 12/10/21   PREOPERATIVE DIAGNOSIS: Intrauterine pregnancy at  6  wga, Indication: RCS and desires permanent sterility.    POSTOPERATIVE DIAGNOSIS: The same   PROCEDURE:    Repeat Low Transverse Cesarean Section with BTL   SURGEON:  Dr. Lucillie Garfinkel   INDICATIONS: This is a 31yo G2P1 at 44 wga requiring cesarean section secondary to repeat and desires permanent sterility.Decision made to proceed with LTCS. The risks of cesarean section discussed with the patient included but were not limited to: bleeding which may require transfusion or reoperation; infection which may require antibiotics; injury to bowel, bladder, ureters or other surrounding organs; injury to the fetus; need for additional procedures including hysterectomy in the event of a life-threatening hemorrhage; placental abnormalities wth subsequent pregnancies, incisional problems, thromboembolic phenomenon and other postoperative/anesthesia complications. We also discussed bilateral tubal ligation in detail. The alternatives to permanent sterilization were reviewed and it was discussed that this is considered permanent. We reviewed the risks in detail including regret, failure and ectopic pregnancy.   The patient agreed with the proposed plan, giving informed consent for the procedure.     FINDINGS:  Viable female infant in vertex presentation, APGARspending,  Weight pending, Amniotic fluid clear,  Intact placenta, three vessel cord.  Grossly normal uterus, ovaries and fallopian tubes. .   ANESTHESIA:    Epidural ESTIMATED BLOOD LOSS: 448cc SPECIMENS: Placenta for routine COMPLICATIONS: None immediate   PROCEDURE IN DETAIL:     The patient received intravenous antibiotics (2g Ancef) and had sequential compression devices applied to her lower extremities while in the preoperative area.  She was then taken to the operating room where epidural anesthesia was dosed up to surgical level and was found to be adequate. She was  then placed in a dorsal supine position with a leftward tilt, and prepped and draped in a sterile manner.  A foley catheter was placed into her bladder and attached to constant gravity.  After an adequate timeout was performed, a Pfannenstiel skin incision was made with scalpel and carried through to the underlying layer of fascia. The fascia was incised in the midline and this incision was extended bilaterally using the Mayo scissors. Kocher clamps were applied to the superior aspect of the fascial incision and the underlying rectus muscles were dissected off bluntly. A similar process was carried out on the inferior aspect of the facial incision. The rectus muscles were separated in the midline bluntly and the peritoneum was entered bluntly.  A bladder flap was created sharply and developed bluntly. A transverse hysterotomy was made with a scalpel and extended bilaterally bluntly. The bladder blade was then removed. The infant was successfully delivered, and cord was clamped and cut and infant was handed over to awaiting neonatology team. Uterine massage was then administered and the placenta delivered intact with three-vessel cord. Cord gases were taken. The uterus was cleared of clot and debris.  The hysterotomy was closed with 0 vicryl.  A second imbricating suture of 0-vicryl was used to reinforce the incision and aid in hemostasis. A bilateral tubal ligation was performed via modified pomeroy in the usual fashion. The right fallopian tube required an additional clamp for slight oozing. It was again ligated an double stitched with excellent hemostasis noted.   Good hemostasis was noted before and after uterus and fallopian tubes placed back into abdomen. The fascia was closed with 0-Vicryl in a running fashion with good restoration of anatomy.  The subcutaneus tissue was irrigated and noted to be hemostatic.  The skin was closed with 4-0 Vicryl in a subcuticular fashion.   Final EBL was 448cc (all surgical  site and was hemostatic at end of procedure) without any further bleeding on exam.   It's a boy - "Glennon Mac" weighing 7#7!!     Pt tolerated the procedure well. All sponge/lap/needle counts were correct  X 2. Pt taken to recovery room in stable condition.     Lucillie Garfinkel MD

## 2021-12-10 NOTE — Anesthesia Postprocedure Evaluation (Signed)
Anesthesia Post Note  Patient: Damien Fusi  Procedure(s) Performed: REPEAT CESAREAN SECTION WITH BILATERAL TUBAL LIGATION (Abdomen)     Patient location during evaluation: PACU Anesthesia Type: Spinal Level of consciousness: awake and alert and oriented Pain management: pain level controlled Vital Signs Assessment: post-procedure vital signs reviewed and stable Respiratory status: spontaneous breathing, nonlabored ventilation and respiratory function stable Cardiovascular status: blood pressure returned to baseline and stable Postop Assessment: no headache, no backache, spinal receding and no apparent nausea or vomiting Anesthetic complications: no   No notable events documented.  Last Vitals:  Vitals:   12/10/21 0930 12/10/21 0953  BP: (!) 95/54 (!) 103/55  Pulse: 69 61  Resp: (!) 21 16  Temp: 36.4 C 36.7 C  SpO2: 95% 100%    Last Pain:  Vitals:   12/10/21 0953  TempSrc: Oral  PainSc: 0-No pain                 Lannie Fields

## 2021-12-10 NOTE — Progress Notes (Signed)
No updates to above H&P. Patient arrived NPO and was consented in PACU. Risks again discussed, all questions answered, and consent signed. Proceed with above surgery.    Lindon Kiel MD  

## 2021-12-10 NOTE — Progress Notes (Signed)
MOB was referred for history of depression/anxiety. °* Referral screened out by Clinical Social Worker because none of the following criteria appear to apply: °~ History of anxiety/depression during this pregnancy, or of post-partum depression following prior delivery. °~ Diagnosis of anxiety and/or depression within last 3 years °OR °* MOB's symptoms currently being treated with medication and/or therapy. Per prenatal records review, no concerns were mentioned and MOB has a therapist.  °Please contact the Clinical Social Worker if needs arise, by MOB request, or if MOB scores greater than 9/yes to question 10 on Edinburgh Postpartum Depression Screen. ° °Camdyn Laden, LCSW °Clinical Social Worker °[facility] °Cell#: [phone] ° °

## 2021-12-11 LAB — CBC
HCT: 28.6 % — ABNORMAL LOW (ref 36.0–46.0)
Hemoglobin: 9.6 g/dL — ABNORMAL LOW (ref 12.0–15.0)
MCH: 31.1 pg (ref 26.0–34.0)
MCHC: 33.6 g/dL (ref 30.0–36.0)
MCV: 92.6 fL (ref 80.0–100.0)
Platelets: 154 10*3/uL (ref 150–400)
RBC: 3.09 MIL/uL — ABNORMAL LOW (ref 3.87–5.11)
RDW: 14 % (ref 11.5–15.5)
WBC: 13.4 10*3/uL — ABNORMAL HIGH (ref 4.0–10.5)
nRBC: 0 % (ref 0.0–0.2)

## 2021-12-11 LAB — SURGICAL PATHOLOGY

## 2021-12-11 LAB — BIRTH TISSUE RECOVERY COLLECTION (PLACENTA DONATION)

## 2021-12-11 NOTE — Progress Notes (Signed)
Subjective: Postpartum Day 1: Cesarean Delivery Patient reports incisional pain, tolerating PO, and no problems voiding.    Objective: Vital signs in last 24 hours: Temp:  [97.5 F (36.4 C)-98.4 F (36.9 C)] 98 F (36.7 C) (02/03 0508) Pulse Rate:  [57-67] 67 (02/03 0508) Resp:  [16-18] 18 (02/03 0508) BP: (103-115)/(49-69) 107/69 (02/03 0508) SpO2:  [97 %-100 %] 100 % (02/03 1540)  Physical Exam:  General: alert, cooperative, appears stated age, and no distress Lochia: appropriate Uterine Fundus: firm Incision: bandage dry DVT Evaluation: No evidence of DVT seen on physical exam.  Recent Labs    12/11/21 0501  HGB 9.6*  HCT 28.6*    Assessment/Plan: Status post Cesarean section. Doing well postoperatively.  Continue current care Circ today Anticpate d/c tomorrow.  Turner Daniels 12/11/2021, 9:52 AM

## 2021-12-12 MED ORDER — IBUPROFEN 600 MG PO TABS: 600.0000 mg | ORAL_TABLET | Freq: Four times a day (QID) | ORAL | 0 refills | Status: DC | PRN

## 2021-12-12 MED ORDER — OXYCODONE HCL 5 MG PO TABS: 5.0000 mg | ORAL_TABLET | ORAL | 0 refills | Status: DC | PRN

## 2021-12-12 NOTE — Discharge Summary (Signed)
Postpartum Discharge Summary      Patient Name: Wendy Potter DOB: [birth year] MRN: [id number]  Date of admission: 12/10/2021 Delivery date:12/10/2021  Delivering provider: Tyson Dense  Date of discharge: 12/12/2021  Admitting diagnosis: Pregnancy [Z34.90] Intrauterine pregnancy: [redacted]w[redacted]d    Secondary diagnosis:  Principal Problem:   Pregnancy  Additional problems:      Discharge diagnosis: Term Pregnancy Delivered                                              Post partum procedures:   Augmentation: N/A Complications: None  Hospital course: Sceduled C/S   31y.o. yo G2P2002 at 387w5das admitted to the hospital 12/10/2021 for scheduled cesarean section with the following indication:Elective Repeat.Delivery details are as follows:  Membrane Rupture Time/Date: 7:50 AM ,12/10/2021   Delivery Method:C-Section, Low Transverse  Details of operation can be found in separate operative note.  Patient had an uncomplicated postpartum course.  She is ambulating, tolerating a regular diet, passing flatus, and urinating well. Patient is discharged home in stable condition on  12/12/21        Newborn Data: Birth date:12/10/2021  Birth time:7:50 AM  Gender:Female  Living status:Living  Apgars:8 ,9  Weight:3510 g     Magnesium Sulfate received: No BMZ received: No Rhophylac:N/A MMR:N/A T-DaP:Given prenatally Flu: N/A Transfusion:No  Physical exam  Vitals:   12/11/21 0508 12/11/21 1500 12/11/21 1945 12/12/21 0544  BP: 107/69 118/62 124/67 (!) 114/56  Pulse: 67 67 73 76  Resp: _0 Temp: 98 F (36.7 C) 98.1 F (36.7 C) 98.7 F (37.1 C) 98 F (36.7 C)  TempSrc: Oral Oral Oral Oral  SpO2: 100% 98% 98% 97%  Weight:      Height:       General: alert, cooperative, and no distress Lochia: appropriate Uterine Fundus: firm Incision: Healing well with no significant drainage DVT Evaluation: No evidence of DVT seen on physical exam. Labs: Lab Results  Component Value Date    WBC 13.4 (H) 12/11/2021   HGB 9.6 (L) 12/11/2021   HCT 28.6 (L) 12/11/2021   MCV 92.6 12/11/2021   PLT 154 12/11/2021   CMP Latest Ref Rng & Units 08/05/2018  Glucose 70 - 99 mg/dL 98  BUN 6 - 20 mg/dL 12  Creatinine 0.44 - 1.00 mg/dL 0.72  Sodium 135 - 145 mmol/L 138  Potassium 3.5 - 5.1 mmol/L 3.9  Chloride 98 - 111 mmol/L 110  CO2 22 - 32 mmol/L 21(L)  Calcium 8.9 - 10.3 mg/dL 9.0   Edinburgh Score: Edinburgh Postnatal Depression Scale Screening Tool 12/10/2021  I have been able to laugh and see the funny side of things. 0  I have looked forward with enjoyment to things. 0  I have blamed myself unnecessarily when things went wrong. 1  I have been anxious or worried for no good reason. 1  I have felt scared or panicky for no good reason. 0  Things have been getting on top of me. 0  I have been so unhappy that I have had difficulty sleeping. 0  I have felt sad or miserable. 0  I have been so unhappy that I have been crying. 0  The thought of harming myself has occurred to me. 0  Edinburgh Postnatal Depression Scale Total 2      After  visit meds:  Allergies as of 12/12/2021   No Known Allergies      Medication List     STOP taking these medications    valACYclovir 500 MG tablet Commonly known as: VALTREX       TAKE these medications    acetaminophen 325 MG tablet Commonly known as: TYLENOL Take 650 mg by mouth every 6 (six) hours as needed (for pain).   ibuprofen 600 MG tablet Commonly known as: ADVIL Take 1 tablet (600 mg total) by mouth every 6 (six) hours as needed for moderate pain, cramping or mild pain.   oxyCODONE 5 MG immediate release tablet Commonly known as: Oxy IR/ROXICODONE Take 1-2 tablets (5-10 mg total) by mouth every 4 (four) hours as needed for moderate pain.   prenatal multivitamin Tabs tablet Take 1 tablet by mouth daily.         Discharge home in stable condition Infant Feeding: Breast Infant Disposition:home with  mother Discharge instruction: per After Visit Summary and Postpartum booklet. Activity: Advance as tolerated. Pelvic rest for 6 weeks.  Diet: routine diet  Anticipated Birth Control: BTL done PP Postpartum Appointment:6 weeks Additional Postpartum F/U:    Future Appointments:No future appointments. Follow up Visit:      12/12/2021 Luz Lex, MD

## 2021-12-22 ENCOUNTER — Telehealth (HOSPITAL_COMMUNITY): Payer: Self-pay | Admitting: *Deleted

## 2021-12-22 NOTE — Telephone Encounter (Signed)
Phone voicemail message left to return nurse call.  Duffy Rhody, RN 12-22-2021 at 3:30pm

## 2022-07-28 ENCOUNTER — Emergency Department (HOSPITAL_COMMUNITY): Payer: BC Managed Care – PPO

## 2022-07-28 ENCOUNTER — Encounter (HOSPITAL_COMMUNITY): Payer: Self-pay

## 2022-07-28 ENCOUNTER — Emergency Department (HOSPITAL_COMMUNITY)
Admission: EM | Admit: 2022-07-28 | Discharge: 2022-07-28 | Disposition: A | Discharge status: Home / Self Care | Payer: BC Managed Care – PPO | Attending: Emergency Medicine | Admitting: Emergency Medicine

## 2022-07-28 ENCOUNTER — Other Ambulatory Visit: Payer: Self-pay

## 2022-07-28 DIAGNOSIS — N132 Hydronephrosis with renal and ureteral calculous obstruction: Secondary | ICD-10-CM | POA: Diagnosis not present

## 2022-07-28 DIAGNOSIS — R7401 Elevation of levels of liver transaminase levels: Secondary | ICD-10-CM | POA: Insufficient documentation

## 2022-07-28 DIAGNOSIS — D72829 Elevated white blood cell count, unspecified: Secondary | ICD-10-CM | POA: Insufficient documentation

## 2022-07-28 DIAGNOSIS — R1032 Left lower quadrant pain: Secondary | ICD-10-CM | POA: Diagnosis present

## 2022-07-28 DIAGNOSIS — N201 Calculus of ureter: Secondary | ICD-10-CM

## 2022-07-28 LAB — CBC WITH DIFFERENTIAL/PLATELET
Abs Immature Granulocytes: 0.05 10*3/uL (ref 0.00–0.07)
Basophils Absolute: 0 10*3/uL (ref 0.0–0.1)
Basophils Relative: 0 %
Eosinophils Absolute: 0.1 10*3/uL (ref 0.0–0.5)
Eosinophils Relative: 1 %
HCT: 41 % (ref 36.0–46.0)
Hemoglobin: 13.8 g/dL (ref 12.0–15.0)
Immature Granulocytes: 1 %
Lymphocytes Relative: 14 %
Lymphs Abs: 1.6 10*3/uL (ref 0.7–4.0)
MCH: 30.6 pg (ref 26.0–34.0)
MCHC: 33.7 g/dL (ref 30.0–36.0)
MCV: 90.9 fL (ref 80.0–100.0)
Monocytes Absolute: 0.4 10*3/uL (ref 0.1–1.0)
Monocytes Relative: 4 %
Neutro Abs: 8.7 10*3/uL — ABNORMAL HIGH (ref 1.7–7.7)
Neutrophils Relative %: 80 %
Platelets: 169 10*3/uL (ref 150–400)
RBC: 4.51 MIL/uL (ref 3.87–5.11)
RDW: 13.6 % (ref 11.5–15.5)
WBC: 10.8 10*3/uL — ABNORMAL HIGH (ref 4.0–10.5)
nRBC: 0 % (ref 0.0–0.2)

## 2022-07-28 LAB — URINALYSIS, ROUTINE W REFLEX MICROSCOPIC
Bilirubin Urine: NEGATIVE
Glucose, UA: NEGATIVE mg/dL
Hgb urine dipstick: NEGATIVE
Ketones, ur: NEGATIVE mg/dL
Leukocytes,Ua: NEGATIVE
Nitrite: NEGATIVE
Protein, ur: 100 mg/dL — AB
Specific Gravity, Urine: 1.027 (ref 1.005–1.030)
pH: 7 (ref 5.0–8.0)

## 2022-07-28 LAB — COMPREHENSIVE METABOLIC PANEL
ALT: 24 U/L (ref 0–44)
AST: 21 U/L (ref 15–41)
Albumin: 4.4 g/dL (ref 3.5–5.0)
Alkaline Phosphatase: 66 U/L (ref 38–126)
Anion gap: 7 (ref 5–15)
BUN: 15 mg/dL (ref 6–20)
CO2: 21 mmol/L — ABNORMAL LOW (ref 22–32)
Calcium: 9.9 mg/dL (ref 8.9–10.3)
Chloride: 113 mmol/L — ABNORMAL HIGH (ref 98–111)
Creatinine, Ser: 0.67 mg/dL (ref 0.44–1.00)
GFR, Estimated: >60 mL/min (ref >60)
Glucose, Bld: 107 mg/dL — ABNORMAL HIGH (ref 70–99)
Potassium: 4.2 mmol/L (ref 3.5–5.1)
Sodium: 141 mmol/L (ref 135–145)
Total Bilirubin: 0.7 mg/dL (ref 0.3–1.2)
Total Protein: 7.1 g/dL (ref 6.5–8.1)

## 2022-07-28 LAB — I-STAT BETA HCG BLOOD, ED (MC, WL, AP ONLY): I-stat hCG, quantitative: <5 m[IU]/mL (ref <5)

## 2022-07-28 LAB — LIPASE, BLOOD: Lipase: 28 U/L (ref 11–51)

## 2022-07-28 MED ORDER — ONDANSETRON 4 MG PO TBDP: 4.0000 mg | ORAL_TABLET | Freq: Two times a day (BID) | ORAL | 0 refills | Status: DC | PRN

## 2022-07-28 MED ORDER — HYDROCODONE-ACETAMINOPHEN 5-325 MG PO TABS: 1.0000 | ORAL_TABLET | Freq: Four times a day (QID) | ORAL | 0 refills | Status: DC | PRN

## 2022-07-28 MED ORDER — KETOROLAC TROMETHAMINE 15 MG/ML IJ SOLN
15.0000 mg | Freq: Once | INTRAMUSCULAR | Status: AC
  Administered 2022-07-28: 15 mg via INTRAVENOUS
  Filled 2022-07-28: qty 1

## 2022-07-28 MED ORDER — FENTANYL CITRATE PF 50 MCG/ML IJ SOSY
100.0000 ug | PREFILLED_SYRINGE | Freq: Once | INTRAMUSCULAR | Status: AC
Start: 2022-07-28 — End: 2022-07-28
  Administered 2022-07-28: 100 ug via INTRAVENOUS
  Filled 2022-07-28: qty 2

## 2022-07-28 NOTE — ED Provider Notes (Signed)
[facility]-EMERGENCY DEPT Provider Note   CSN: [id number] Arrival date & time: 07/28/22  9629     History  Chief Complaint  Patient presents with   Flank Pain    NADEAN MONTANARO is a 31 y.o. female reports a history of kidney stone disease otherwise healthy.  Patient has a 37-month-old son at home who she is currently breast-feeding.   Patient presents today for left flank pain rating down to her left lower abdomen intermittent x2 weeks, patient believes this is due to a kidney stone she reports seeing small stones in her urine over the past 2 weeks.  She describes pain as severe and intermittent, sharp in nature, no aggravating or alleviating factors.  Patient reports she went to medic urgent care 1 week ago but was not provided any medication due to the fact that she is breast-feeding.  Patient reports pain was so severe this morning that caused her to fall onto her left low back she reports increased pain since that time.  She reports her pain is associated with few episodes of nonbloody/nonbilious emesis.  She had some relief with fentanyl given by EMS but that medication has started to wear off.  Patient denies fever, chills, chest pain/shortness of breath, saddle paresthesias, incontinence, retention, extremity weakness or any additional concerns.  HPI     Home Medications Prior to Admission medications   Medication Sig Start Date End Date Taking? Authorizing Provider  HYDROcodone-acetaminophen (NORCO/VICODIN) 5-325 MG tablet Take 1 tablet by mouth every 6 (six) hours as needed. 07/28/22  Yes Harlene Salts A, PA-C  ondansetron (ZOFRAN-ODT) 4 MG disintegrating tablet Take 1 tablet (4 mg total) by mouth every 12 (twelve) hours as needed for nausea or vomiting. 07/28/22  Yes Harlene Salts A, PA-C  acetaminophen (TYLENOL) 325 MG tablet Take 650 mg by mouth every 6 (six) hours as needed (for pain).     [provider]  Prenatal Vit-Fe Fumarate-FA  (PRENATAL MULTIVITAMIN) TABS tablet Take 1 tablet by mouth daily.    [provider]      Allergies    Patient has no known allergies.    Review of Systems   Review of Systems Ten systems are reviewed and are negative for acute change except as noted in the HPI  Physical Exam Updated Vital Signs BP (!) 103/53   Pulse (!) 57   Temp 98.5 F (36.9 C) (Oral)   Resp 17   Ht 5\' 7"  (1.702 m)   Wt 72 kg   SpO2 99%   Breastfeeding Yes Comment: negative HCG blood test 07-28-2022  BMI 24.86 kg/m  Physical Exam Constitutional:      General: She is not in acute distress.    Appearance: Normal appearance. She is well-developed. She is not ill-appearing or diaphoretic.  HENT:     Head: Normocephalic and atraumatic.  Eyes:     General: Vision grossly intact. Gaze aligned appropriately.     Pupils: Pupils are equal, round, and reactive to light.  Neck:     Trachea: Trachea and phonation normal.  Cardiovascular:     Rate and Rhythm: Normal rate and regular rhythm.     Pulses: Normal pulses.  Pulmonary:     Effort: Pulmonary effort is normal. No respiratory distress.  Abdominal:     General: There is no distension.     Palpations: Abdomen is soft.     Tenderness: There is no abdominal tenderness. There is left CVA tenderness. There is no right  CVA tenderness, guarding or rebound.  Musculoskeletal:        General: Normal range of motion.     Cervical back: Normal range of motion.     Right lower leg: No edema.     Left lower leg: No edema.  Skin:    General: Skin is warm and dry.  Neurological:     Mental Status: She is alert.     GCS: GCS eye subscore is 4. GCS verbal subscore is 5. GCS motor subscore is 6.     Comments: Speech is clear and goal oriented, follows commands Major Cranial nerves without deficit, no facial droop Moves extremities without ataxia, coordination intact  Psychiatric:        Behavior: Behavior normal.     ED Results / Procedures / Treatments    Labs (all labs ordered are listed, but only abnormal results are displayed) Labs Reviewed  CBC WITH DIFFERENTIAL/PLATELET - Abnormal; Notable for the following components:      Result Value   WBC 10.8 (*)    Neutro Abs 8.7 (*)    All other components within normal limits  COMPREHENSIVE METABOLIC PANEL - Abnormal; Notable for the following components:   Chloride 113 (*)    CO2 21 (*)    Glucose, Bld 107 (*)    All other components within normal limits  URINALYSIS, ROUTINE W REFLEX MICROSCOPIC - Abnormal; Notable for the following components:   APPearance CLOUDY (*)    Protein, ur 100 (*)    Bacteria, UA RARE (*)    All other components within normal limits  LIPASE, BLOOD  I-STAT BETA HCG BLOOD, ED ([facility], [facility], [facility] ONLY)    EKG None  Radiology CT Renal Stone Study  Result Date: 07/28/2022 CLINICAL DATA:  Flank pain with kidney stone suspected EXAM: CT ABDOMEN AND PELVIS WITHOUT CONTRAST TECHNIQUE: Multidetector CT imaging of the abdomen and pelvis was performed following the standard protocol without IV contrast. RADIATION DOSE REDUCTION: This exam was performed according to the departmental dose-optimization program which includes automated exposure control, adjustment of the mA and/or kV according to patient size and/or use of iterative reconstruction technique. COMPARISON:  None Available. FINDINGS: Lower chest:  No contributory findings. Hepatobiliary: No focal liver abnormality.No evidence of biliary obstruction or stone. Pancreas: Unremarkable. Spleen: Unremarkable. Adrenals/Urinary Tract: Negative adrenals. Left hydroureteronephrosis with 4 x 3 mm stone in the distal ureter. Similar size calculus at the lower pole left kidney. Unremarkable bladder. Stomach/Bowel:  No obstruction. No appendicitis. Vascular/Lymphatic: No acute vascular abnormality. No mass or adenopathy. Reproductive:No pathologic findings. Other: No ascites or pneumoperitoneum. Musculoskeletal: No acute abnormalities.  IMPRESSION: 1. Left hydroureteronephrosis from a 4 x 3 mm distal ureteral calculus. 2. Left nephrolithiasis. Electronically Signed   By: Tiburcio PeaJonathan  Watts M.D.   On: 07/28/2022 10:06   CT L-SPINE NO CHARGE  Result Date: 07/28/2022 CLINICAL DATA:  Flank pain EXAM: CT Lumbar Spine without contrast TECHNIQUE: Technique: Multiplanar CT images of the lumbar spine were reconstructed from contemporary CT of the Abdomen and Pelvis. RADIATION DOSE REDUCTION: This exam was performed according to the departmental dose-optimization program which includes automated exposure control, adjustment of the mA and/or kV according to patient size and/or use of iterative reconstruction technique. CONTRAST:  None COMPARISON:  None Available. FINDINGS: Segmentation: 5 lumbar type vertebrae. Alignment: Minimal curvature which could be positional Vertebrae: No acute fracture or focal pathologic process. Paraspinal and other soft tissues: Left hydronephrosis described on dedicated abdominal CT. Disc levels: No visible herniation or  impingement. IMPRESSION: No acute finding or impingement seen in the lumbar spine. Electronically Signed   By: Jorje Guild M.D.   On: 07/28/2022 10:04    Procedures Procedures    Medications Ordered in ED Medications  fentaNYL (SUBLIMAZE) injection 100 mcg (100 mcg Intravenous Given 07/28/22 0855)  ketorolac (TORADOL) 15 MG/ML injection 15 mg (15 mg Intravenous Given 07/28/22 1030)    ED Course/ Medical Decision Making/ A&P Clinical Course as of 07/28/22 1238  Wed Jul 28, 2022  0926 I-Stat Beta hCG blood, ED ([facility], [facility], [facility] only) Pregnancy test negative [BM]  0927 Lipase, blood Lipase within normal limits, doubt pancreatitis [BM]  0927 Comprehensive metabolic panel(!) CMP without emergent electrolyte derangement, AKI, LFT elevations or gap [BM]  0927 CBC with Differential(!) CBC shows mild leukocytosis of 10.8.  No anemia or thrombocytopenia. [BM]  0927 Urinalysis, Routine w reflex  microscopic Urine, Clean Catch(!) Urinalysis shows 6-10 RBCs suggestive of kidney stone disease.  Proteinuria present.  Rare bacteria and 11-20 squamous cells. Doubt UTI suspect contaminated catch. [BM]  4235 CT Renal Stone Study I have ordered, reviewed and personally interpreted patient's CT renal stone study.  I agree with the radiologist that patient has a calculus in the distal left ureter along with a second stone within the left kidney.  Left hydroureteronephrosis present.  I do not appreciate any other acute pathology. [BM]  91 CT L-SPINE NO CHARGE I have personally reviewed patient's CT L-spine.  I do not appreciate any obvious acute fracture or traumatic spondylolisthesis. [BM]  1212 EKG 12-Lead I have personally reviewed and interpreted patient's twelve-lead EKG.  I do not appreciate any obvious acute ischemic changes.  Shows sinus bradycardia with rate 55.  Patient reports that this is not new for her and she has had discussions with her providers in the past regarding it.  She has no symptoms to suggest symptomatic bradycardia at this time. [BM]    Clinical Course User Index [BM] Gari Crown                           Medical Decision Making 31 year old female presented for 2 weeks of left flank pain she reports history of kidney stone disease.  She reports seeing small stones in her urine over the past few days.  She denies any infectious symptoms.  She does report a fall earlier today due to pain with some worsening left back pain.  On exam she is uncomfortable appearing but not in any acute distress.  Her abdomen is soft and nontender.  She has left CVA tenderness on exam.  No midline spinal tenderness.  She has no pain with compression of the pelvis.  She is neurovascularly intact to both lower extremities.  I discussed obtaining CT renal stone study for evaluation of kidney stone with the patient today, risk versus benefits were discussed and patient wished to proceed  with CT imaging.  Will obtain base abdominal pain labs and provide pain medication.  We will continue to monitor.  Differential includes but not limited to kidney stone disease, musculoskeletal pain, diverticulitis, pyelonephritis/UTI, sciatica.  Amount and/or Complexity of Data Reviewed Labs: ordered. Decision-making details documented in ED Course. Radiology: ordered. Decision-making details documented in ED Course. ECG/medicine tests:  Decision-making details documented in ED Course.  Risk Prescription drug management. Risk Details: Patient's work-up today shows 4 mm x 2 mm kidney stone at the left distal ureter with hydroureter nephrosis.  She has  no AKI or evidence for UTI/pyelonephritis.  Her pain was controlled following IV Toradol today.  There is no indication for further ER work-up or admission at this time.  I discussed treatment options with patient today and shared decision-making was made.  Patient reports that she has adequate frozen breastmilk stores at home and can also use formula to feed her 69-month-old child.  Patient will be provided with a course of Norco to help with her symptoms, she will avoid breast-feeding and discard breastmilk while taking this medication.  Patient stated understanding of narcotic precautions and breast-feeding today.  She will discard breastmilk while taking narcotics.  Advised to wait 48 hours before returning to breast-feeding after last dose or discussing it with the pediatrician/primary care provider before.  Patient was referred to Dr. Laverle Patter at [facility] urology for further management of her kidney stone disease.  I offered patient a course of Flomax help with her symptoms but she reports that this caused her to feel too lightheaded and dizzy in the past and she deferred that prescription.    At this time there does not appear to be any evidence of an acute emergency medical condition and the patient appears stable for discharge with appropriate  outpatient follow up. Diagnosis was discussed with patient who verbalizes understanding of care plan and is agreeable to discharge. I have discussed return precautions with patient who verbalizes understanding. Patient encouraged to follow-up with their PCP and urology. All questions answered.   Note: Portions of this report may have been transcribed using voice recognition software. Every effort was made to ensure accuracy; however, inadvertent computerized transcription errors may still be present.         Final Clinical Impression(s) / ED Diagnoses Final diagnoses:  Ureterolithiasis    Rx / DC Orders ED Discharge Orders          Ordered    Strain all urine        07/28/22 1212    HYDROcodone-acetaminophen (NORCO/VICODIN) 5-325 MG tablet  Every 6 hours PRN        07/28/22 1226    ondansetron (ZOFRAN-ODT) 4 MG disintegrating tablet  Every 12 hours PRN        07/28/22 1226              Bill Salinas, PA-C 07/28/22 1239    Melene Plan, DO 07/28/22 1400

## 2022-07-28 NOTE — ED Triage Notes (Signed)
Pt reports Left flank pain radiating to LLQ x4 hours with nausea.  Pt reports Hx of obstructing Kidney stones and passing few small ones over past few days.  Ems admin 139mcg fent and 4mg  zofran

## 2022-07-28 NOTE — Discharge Instructions (Addendum)
At this time there does not appear to be the presence of an emergent medical condition, however there is always the potential for conditions to change. Please read and follow the below instructions.  Please return to the Emergency Department immediately for any new or worsening symptoms or if your symptoms do not improve within 3 days. Please be sure to follow up with your Primary Care Provider within one week regarding your visit today; please call their office to schedule an appointment even if you are feeling better for a follow-up visit. You have been given an NSAID-containing medication called Toradol today.  Do not take the medications including ibuprofen, Aleve, Advil, naproxen or other NSAID-containing medications for the next 2 days.  Please be sure to drink plenty of water over the next few days. You may take the medication Norco (Hydrocodone/Acetaminophen) as prescribed to help with severe pain.  This medication will make you drowsy so do not drive, drink alcohol, take other sedating medications or perform any dangerous activities such as driving after taking Norco. Norco contains Tylenol (acetaminophen) so do not take any other Tylenol-containing products with Norco.  Do not breast-feed while taking this medication.  Please use your frozen milk stores or formula to feed your child.  Please discuss with the pediatrician or your primary care provider before resuming breast-feeding.  It is reasonable to wait at least 48 hours after your last Norco before returning to breast-feeding.  Please discard all of your breastmilk while taking Norco. You received IV pain medication in the ER today.  Do not breast-feed today.  Do not drive or perform any other potentially dangerous activities for the rest of the day.   Please read the additional information packets attached to your discharge summary.  Go to the nearest Emergency Department immediately if: You have fever or chills You get very bad  pain. You get new pain in your belly (abdomen). You pass out (faint). You cannot pee. You have any new/concerning or worsening of symptoms.  Do not take your medicine if  develop an itchy rash, swelling in your mouth or lips, or difficulty breathing; call 911 and seek immediate emergency medical attention if this occurs.  You may review your lab tests and imaging results in their entirety on your MyChart account.  Please discuss all results of fully with your primary care provider and other specialist at your follow-up visit.  Note: Portions of this text may have been transcribed using voice recognition software. Every effort was made to ensure accuracy; however, inadvertent computerized transcription errors may still be present.

## 2022-09-08 DIAGNOSIS — Z124 Encounter for screening for malignant neoplasm of cervix: Secondary | ICD-10-CM | POA: Diagnosis not present

## 2022-09-08 DIAGNOSIS — Z1151 Encounter for screening for human papillomavirus (HPV): Secondary | ICD-10-CM | POA: Diagnosis not present

## 2022-09-08 DIAGNOSIS — Z01419 Encounter for gynecological examination (general) (routine) without abnormal findings: Secondary | ICD-10-CM | POA: Diagnosis not present

## 2023-09-14 DIAGNOSIS — Z6821 Body mass index (BMI) 21.0-21.9, adult: Secondary | ICD-10-CM | POA: Diagnosis not present

## 2023-09-14 DIAGNOSIS — Z01419 Encounter for gynecological examination (general) (routine) without abnormal findings: Secondary | ICD-10-CM | POA: Diagnosis not present

## 2024-01-07 DIAGNOSIS — Q2730 Arteriovenous malformation, site unspecified: Secondary | ICD-10-CM | POA: Insufficient documentation

## 2024-01-16 ENCOUNTER — Encounter (HOSPITAL_BASED_OUTPATIENT_CLINIC_OR_DEPARTMENT_OTHER): Payer: Self-pay

## 2024-01-16 ENCOUNTER — Other Ambulatory Visit: Payer: Self-pay

## 2024-01-16 ENCOUNTER — Emergency Department (HOSPITAL_BASED_OUTPATIENT_CLINIC_OR_DEPARTMENT_OTHER)
Admission: EM | Admit: 2024-01-16 | Discharge: 2024-01-16 | Disposition: A | Discharge status: Home / Self Care | Attending: Emergency Medicine | Admitting: Emergency Medicine

## 2024-01-16 ENCOUNTER — Ambulatory Visit (HOSPITAL_BASED_OUTPATIENT_CLINIC_OR_DEPARTMENT_OTHER)
Admission: EM | Admit: 2024-01-16 | Discharge: 2024-01-16 | Disposition: A | Discharge status: Home / Self Care | Payer: Self-pay

## 2024-01-16 ENCOUNTER — Encounter (HOSPITAL_BASED_OUTPATIENT_CLINIC_OR_DEPARTMENT_OTHER): Payer: Self-pay | Admitting: Emergency Medicine

## 2024-01-16 ENCOUNTER — Emergency Department (HOSPITAL_BASED_OUTPATIENT_CLINIC_OR_DEPARTMENT_OTHER)

## 2024-01-16 DIAGNOSIS — R220 Localized swelling, mass and lump, head: Secondary | ICD-10-CM | POA: Diagnosis not present

## 2024-01-16 DIAGNOSIS — Q282 Arteriovenous malformation of cerebral vessels: Secondary | ICD-10-CM | POA: Diagnosis not present

## 2024-01-16 DIAGNOSIS — Q2730 Arteriovenous malformation, site unspecified: Secondary | ICD-10-CM

## 2024-01-16 DIAGNOSIS — I671 Cerebral aneurysm, nonruptured: Secondary | ICD-10-CM | POA: Diagnosis not present

## 2024-01-16 LAB — COMPREHENSIVE METABOLIC PANEL
ALT: 15 U/L (ref 0–44)
AST: 15 U/L (ref 15–41)
Albumin: 4.4 g/dL (ref 3.5–5.0)
Alkaline Phosphatase: 34 U/L — ABNORMAL LOW (ref 38–126)
Anion gap: 8 (ref 5–15)
BUN: 9 mg/dL (ref 6–20)
CO2: 22 mmol/L (ref 22–32)
Calcium: 9 mg/dL (ref 8.9–10.3)
Chloride: 108 mmol/L (ref 98–111)
Creatinine, Ser: 0.6 mg/dL (ref 0.44–1.00)
GFR, Estimated: >60 mL/min (ref >60)
Glucose, Bld: 98 mg/dL (ref 70–99)
Potassium: 3.8 mmol/L (ref 3.5–5.1)
Sodium: 138 mmol/L (ref 135–145)
Total Bilirubin: 0.6 mg/dL (ref 0.0–1.2)
Total Protein: 7.5 g/dL (ref 6.5–8.1)

## 2024-01-16 LAB — CBC WITH DIFFERENTIAL/PLATELET
Abs Immature Granulocytes: 0.02 10*3/uL (ref 0.00–0.07)
Basophils Absolute: 0 10*3/uL (ref 0.0–0.1)
Basophils Relative: 1 %
Eosinophils Absolute: 0 10*3/uL (ref 0.0–0.5)
Eosinophils Relative: 1 %
HCT: 36.3 % (ref 36.0–46.0)
Hemoglobin: 12 g/dL (ref 12.0–15.0)
Immature Granulocytes: 0 %
Lymphocytes Relative: 36 %
Lymphs Abs: 1.7 10*3/uL (ref 0.7–4.0)
MCH: 28.5 pg (ref 26.0–34.0)
MCHC: 33.1 g/dL (ref 30.0–36.0)
MCV: 86.2 fL (ref 80.0–100.0)
Monocytes Absolute: 0.3 10*3/uL (ref 0.1–1.0)
Monocytes Relative: 7 %
Neutro Abs: 2.5 10*3/uL (ref 1.7–7.7)
Neutrophils Relative %: 55 %
Platelets: 196 10*3/uL (ref 150–400)
RBC: 4.21 MIL/uL (ref 3.87–5.11)
RDW: 13 % (ref 11.5–15.5)
WBC: 4.6 10*3/uL (ref 4.0–10.5)
nRBC: 0 % (ref 0.0–0.2)

## 2024-01-16 MED ORDER — IOHEXOL 350 MG/ML SOLN
75.0000 mL | Freq: Once | INTRAVENOUS | Status: AC | PRN
  Administered 2024-01-16: 75 mL via INTRAVENOUS

## 2024-01-16 NOTE — ED Notes (Signed)
 Patient is being discharged from the Urgent Care and sent to the Emergency Department via pov . Per  T. Bast FNP, patient is in need of higher level of care due to pulsating scalp mass. Patient is aware and verbalizes understanding of plan of care.  Vitals:   01/16/24 1933  BP: 138/79  Pulse: 87  Resp: 20  Temp: 97.9 F (36.6 C)  SpO2: 98%

## 2024-01-16 NOTE — ED Notes (Signed)
 ED Provider at bedside.

## 2024-01-16 NOTE — Discharge Instructions (Signed)
 Your history, exam, and evaluation today are consistent with an AVM or arteriovenous malformation.  I spoke to Dr. Corliss Skains who would like to see you in clinic this week to create a plan to further evaluate this and take care of it.  He gave me 2 numbers to give you, his scheduler number is [phone] and the office number is [phone].  Please call tomorrow morning to schedule an appointment in the next few days to get this further managed.  Please rest and stay hydrated.  If any symptoms change or worsen acutely, return to the nearest emergency department.
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
## 2024-01-16 NOTE — Discharge Instructions (Signed)
 Please go to the ER for further evaluation

## 2024-01-16 NOTE — ED Provider Notes (Signed)
 [facility] Provider Note   CSN: [id number] Arrival date & time: 01/16/24  2113     History  Chief Complaint  Patient presents with   Mass    Wendy Potter is a 33 y.o. female.  The history is provided by the patient and medical records. No language interpreter was used.  Illness Location:  New pulsatile mass on head Severity:  Moderate Onset quality:  Gradual Duration:  1 week Timing:  Constant Progression:  Worsening Chronicity:  New Associated symptoms: headaches (intermittnetly)   Associated symptoms: no abdominal pain, no chest pain, no congestion, no cough, no diarrhea, no fatigue, no fever, no loss of consciousness, no nausea, no rash, no rhinorrhea, no shortness of breath, no vomiting and no wheezing        Home Medications Prior to Admission medications   Medication Sig Start Date End Date Taking? Authorizing Provider  acetaminophen (TYLENOL) 325 MG tablet Take 650 mg by mouth every 6 (six) hours as needed (for pain).     [provider]      Allergies    Patient has no known allergies.    Review of Systems   Review of Systems  Constitutional:  Negative for chills, fatigue and fever.  HENT:  Negative for congestion and rhinorrhea.   Respiratory:  Negative for cough, chest tightness, shortness of breath and wheezing.   Cardiovascular:  Negative for chest pain.  Gastrointestinal:  Negative for abdominal pain, constipation, diarrhea, nausea and vomiting.  Genitourinary:  Negative for dysuria and flank pain.  Musculoskeletal:  Negative for back pain, neck pain and neck stiffness.  Skin:  Negative for rash and wound.  Neurological:  Positive for headaches (intermittnetly). Negative for dizziness, loss of consciousness, weakness, light-headedness and numbness.  Psychiatric/Behavioral:  Negative for agitation and confusion.   All other systems reviewed and are negative.   Physical Exam Updated Vital  Signs BP 134/82 (BP Location: Left Arm)   Pulse 68   Temp 98.2 F (36.8 C) (Oral)   Resp 17   Ht 5\' 7"  (1.702 m)   Wt 59 kg   SpO2 96%   BMI 20.36 kg/m  Physical Exam Vitals and nursing note reviewed.  Constitutional:      General: She is not in acute distress.    Appearance: She is well-developed. She is not ill-appearing, toxic-appearing or diaphoretic.  HENT:     Head:      Mouth/Throat:     Mouth: Mucous membranes are moist.     Pharynx: No oropharyngeal exudate or posterior oropharyngeal erythema.  Eyes:     Extraocular Movements: Extraocular movements intact.     Conjunctiva/sclera: Conjunctivae normal.     Pupils: Pupils are equal, round, and reactive to light.  Neck:     Vascular: No carotid bruit.  Cardiovascular:     Rate and Rhythm: Normal rate and regular rhythm.     Heart sounds: No murmur heard. Pulmonary:     Effort: Pulmonary effort is normal. No respiratory distress.     Breath sounds: Normal breath sounds. No wheezing, rhonchi or rales.  Chest:     Chest wall: No tenderness.  Abdominal:     General: Abdomen is flat.     Palpations: Abdomen is soft.     Tenderness: There is no abdominal tenderness.  Musculoskeletal:        General: Tenderness present. No swelling.     Cervical back: Neck supple. No tenderness.  Skin:  General: Skin is warm and dry.     Capillary Refill: Capillary refill takes less than 2 seconds.     Findings: No erythema or rash.  Neurological:     General: No focal deficit present.     Mental Status: She is alert.     Sensory: No sensory deficit.     Motor: No weakness.  Psychiatric:        Mood and Affect: Mood normal.     ED Results / Procedures / Treatments   Labs (all labs ordered are listed, but only abnormal results are displayed) Labs Reviewed  COMPREHENSIVE METABOLIC PANEL - Abnormal; Notable for the following components:      Result Value   Alkaline Phosphatase 34 (*)    All other components within normal  limits  CBC WITH DIFFERENTIAL/PLATELET    EKG None  Radiology CT ANGIO HEAD NECK W WO CM Addendum Date: 01/16/2024 ADDENDUM REPORT: 01/16/2024 23:27 ADDENDUM: I discussed the case with Dr. Rush Landmark. On examination, the mass is distinctly pulsatile. On further review, there is an occipital branch of the left external carotid artery that connects to the tangled area of vessels. 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 There is no clear dilated draining vein, but the venous drainage is likely across the calvarium into the superior sagittal sinus. Electronically Signed   By: Deatra Robinson M.D.   On: 01/16/2024 23:27   Result Date: 01/16/2024 CLINICAL DATA:  Posterior scalp mass EXAM: CT ANGIOGRAPHY HEAD AND NECK WITH AND WITHOUT CONTRAST TECHNIQUE: Multidetector CT imaging of the head and neck was performed using the standard protocol during bolus administration of intravenous contrast. Multiplanar CT image reconstructions and MIPs were obtained to evaluate the vascular anatomy. Carotid stenosis measurements (when applicable) are obtained utilizing NASCET criteria, using the distal internal carotid diameter as the denominator. RADIATION DOSE REDUCTION: This exam was performed according to the departmental dose-optimization program which includes automated exposure control, adjustment of the mA and/or kV according to patient size and/or use of iterative reconstruction technique. CONTRAST:  75mL OMNIPAQUE IOHEXOL 350 MG/ML SOLN COMPARISON:  None Available. FINDINGS: CT HEAD FINDINGS Brain: No mass,hemorrhage or extra-axial collection. Normal appearance of the parenchyma and CSF spaces. Vascular: No hyperdense vessel or unexpected vascular calcification. Skull: Vascular mass of the scalp vertex, described below. Sinuses/Orbits: No fluid levels or advanced mucosal thickening of the visualized paranasal sinuses. No mastoid or middle ear effusion. Normal orbits. CTA NECK FINDINGS Skeleton: No  acute abnormality or high grade bony spinal canal stenosis. Other neck: Normal pharynx, larynx and major salivary glands. No cervical lymphadenopathy. Unremarkable thyroid gland. Upper chest: No pneumothorax or pleural effusion. No nodules or masses. Aortic arch: There is no calcific atherosclerosis of the aortic arch. Conventional 3 vessel aortic branching pattern. RIGHT carotid system: Normal without aneurysm, dissection or stenosis. LEFT carotid system: Normal without aneurysm, dissection or stenosis. Vertebral arteries: Codominant configuration. There is no dissection, occlusion or flow-limiting stenosis to the skull base (V1-V3 segments). CTA HEAD FINDINGS POSTERIOR CIRCULATION: Vertebral arteries are normal. No proximal occlusion of the anterior or inferior cerebellar arteries. Basilar artery is normal. Superior cerebellar arteries are normal. Posterior cerebral arteries are normal. 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 Anterior cerebral arteries are normal. Middle cerebral arteries are normal. Venous sinuses: Collection of tangled vessels at the left scalp vertex measures approximately 4.0 x 1.4 cm. There are multiple subjacent transosseous vascular  channels, suggesting intracranial connection via emissary veins, which likely drain into the superior sagittal sinus. Anatomic variants: None Review of the MIP images confirms the above findings. IMPRESSION: 1. Collection of tangled vessels at the left scalp vertex measures approximately 4.0 x 1.4 cm. There are multiple subjacent transosseous vascular channels, suggesting intracranial connection via emissary veins, which likely drain into the superior sagittal sinus. 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 2. 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 3. No hemodynamically  significant stenosis of the head or neck. Electronically Signed: By: Deatra Robinson M.D. On: 01/16/2024 23:11    Procedures Procedures    Medications Ordered in ED Medications  iohexol (OMNIPAQUE) 350 MG/ML injection 75 mL (75 mLs Intravenous Contrast Given 01/16/24 2251)    ED Course/ Medical Decision Making/ A&P                                 Medical Decision Making Amount and/or Complexity of Data Reviewed Labs: ordered. Radiology: ordered.  Risk Prescription drug management.    Wendy Potter is a 33 y.o. female with a past medical history significant for GERD, anxiety, and depression who presents for pulsatile scalp mass.  According to patient, about 5 years ago she had a CT scan done that showed a small lesion on the back of her head that she was told was likely a bruise.  She has had no issues with it since then and did not notice a bump on the back of her head for years.  She says that about 2 weeks ago she noticed a small bump on the left top of her head but was not painful and not bothering her.  She does get headaches on and off that she does think may be related to that location on her head.  She denies any new head injury or trauma but says tonight while washing her hair noticed the area was much bigger and had grown.  It is now much larger, several centimeters in size and is pulsatile.  She reports she is not having headache right now.  She went to urgent care and was told to come to the emergency department.  She reports no current nausea, vomiting, vision changes.  Denies any dizziness.  Denies any numbness, tingling, or weakness of extremities.  Denies any neck pain or neck stiffness.  Denies other complaints.  I was asked to see the patient in triage and did an exam.  Patient does have a pulsatile mass that feels almost like a dialysis fistula on the back of her head between the parietal occipital area.  It is not focally tender but she does feel it slightly being sore.   Otherwise I do not appreciate a carotid bruit and her chest was nontender.  Lungs were clear.  No focal neurologic deficits on my exam.  No erythema, warmth, or skin changes seen initially.  Clinically concerned about AVM or some other thrombosis extracranially.  I called and spoke with neurology who recommended speaking to neuroradiology.  I called and spoke to neuro rad who recommended CTA head and neck.  Anticipate disposition based on imaging findings.  I looked at the images and concerned about AVM.  I called and spoke to neurology and neuroradiology who requested we call Dr. Corliss Skains.  They will call him tomorrow to get set up in  clinic for likely angio and treatment of this AVM.  Given her reassuring exam and symptoms, we will discharge for outpatient follow-up.  They understood return precautions and follow-up instructions and was discharged in good condition.         Final Clinical Impression(s) / ED Diagnoses Final diagnoses:  Scalp mass  AVM (arteriovenous malformation)    Clinical Impression: 1. Scalp mass   2. AVM (arteriovenous malformation)     Disposition: Discharge  Condition: Good  I have discussed the results, Dx and Tx plan with the pt(& family if present). He/she/they expressed understanding and agree(s) with the plan. Discharge instructions discussed at great length. Strict return precautions discussed and pt &/or family have verbalized understanding of the instructions. No further questions at time of discharge.    New Prescriptions   No medications on file    Follow Up: No follow-up provider specified.    Geniene List, Canary Brim, MD 01/16/24 2350

## 2024-01-16 NOTE — ED Triage Notes (Signed)
 Patient presents for evaluation of "pulsating" raised area to back left side of scalp. Palpable soft raised area. Patient's pulse is palpable to this area. Denies injury. Denies pain.

## 2024-01-16 NOTE — ED Triage Notes (Signed)
 Patient coming to ED for evaluation of "pulsating" raised area to back of head.  Reports symptoms started around 2 weeks ago.  No hx of injury, recent falls, or skull fractures.  Has had intermittent headaches.  Was seen at Urgent Care and instructed to come here for evaluation and possible CT.

## 2024-01-17 ENCOUNTER — Other Ambulatory Visit (HOSPITAL_COMMUNITY): Payer: Self-pay | Admitting: Interventional Radiology

## 2024-01-17 DIAGNOSIS — Q2730 Arteriovenous malformation, site unspecified: Secondary | ICD-10-CM

## 2024-01-17 DIAGNOSIS — I671 Cerebral aneurysm, nonruptured: Secondary | ICD-10-CM

## 2024-01-18 ENCOUNTER — Ambulatory Visit (HOSPITAL_COMMUNITY)
Admission: RE | Admit: 2024-01-18 | Discharge: 2024-01-18 | Disposition: A | Discharge status: Home / Self Care | Payer: Self-pay | Source: Ambulatory Visit | Attending: Interventional Radiology | Admitting: Interventional Radiology

## 2024-01-18 DIAGNOSIS — G9389 Other specified disorders of brain: Secondary | ICD-10-CM | POA: Diagnosis not present

## 2024-01-18 DIAGNOSIS — Q2730 Arteriovenous malformation, site unspecified: Secondary | ICD-10-CM

## 2024-01-18 DIAGNOSIS — I671 Cerebral aneurysm, nonruptured: Secondary | ICD-10-CM

## 2024-01-19 ENCOUNTER — Other Ambulatory Visit (HOSPITAL_COMMUNITY): Payer: Self-pay | Admitting: Interventional Radiology

## 2024-01-19 DIAGNOSIS — Q2730 Arteriovenous malformation, site unspecified: Secondary | ICD-10-CM

## 2024-01-19 DIAGNOSIS — I671 Cerebral aneurysm, nonruptured: Secondary | ICD-10-CM

## 2024-01-19 HISTORY — PX: IR RADIOLOGIST EVAL & MGMT: IMG5224

## 2024-01-23 ENCOUNTER — Other Ambulatory Visit (HOSPITAL_COMMUNITY): Payer: Self-pay | Admitting: Student

## 2024-01-23 DIAGNOSIS — Q2730 Arteriovenous malformation, site unspecified: Secondary | ICD-10-CM
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
## 2024-01-27 ENCOUNTER — Other Ambulatory Visit (HOSPITAL_COMMUNITY): Payer: Self-pay | Admitting: Interventional Radiology

## 2024-01-27 ENCOUNTER — Ambulatory Visit (HOSPITAL_COMMUNITY)
Admission: RE | Admit: 2024-01-27 | Discharge: 2024-01-27 | Disposition: A | Discharge status: Home / Self Care | Source: Ambulatory Visit | Attending: Interventional Radiology | Admitting: Interventional Radiology

## 2024-01-27 ENCOUNTER — Other Ambulatory Visit: Payer: Self-pay

## 2024-01-27 DIAGNOSIS — I671 Cerebral aneurysm, nonruptured: Secondary | ICD-10-CM | POA: Insufficient documentation

## 2024-01-27 DIAGNOSIS — Q2730 Arteriovenous malformation, site unspecified: Secondary | ICD-10-CM | POA: Diagnosis not present

## 2024-01-27 DIAGNOSIS — Z87891 Personal history of nicotine dependence: Secondary | ICD-10-CM | POA: Diagnosis not present

## 2024-01-27 DIAGNOSIS — Q2546 Tortuous aortic arch: Secondary | ICD-10-CM | POA: Diagnosis not present

## 2024-01-27 HISTORY — PX: IR ANGIO INTRA EXTRACRAN SEL COM CAROTID INNOMINATE BILAT MOD SED: IMG5360

## 2024-01-27 HISTORY — PX: IR ANGIO VERTEBRAL SEL VERTEBRAL BILAT MOD SED: IMG5369

## 2024-01-27 LAB — BASIC METABOLIC PANEL
Anion gap: 7 (ref 5–15)
BUN: 6 mg/dL (ref 6–20)
CO2: 22 mmol/L (ref 22–32)
Calcium: 8.7 mg/dL — ABNORMAL LOW (ref 8.9–10.3)
Chloride: 107 mmol/L (ref 98–111)
Creatinine, Ser: 0.66 mg/dL (ref 0.44–1.00)
GFR, Estimated: >60 mL/min (ref >60)
Glucose, Bld: 95 mg/dL (ref 70–99)
Potassium: 4.6 mmol/L (ref 3.5–5.1)
Sodium: 136 mmol/L (ref 135–145)

## 2024-01-27 LAB — CBC
HCT: 37.1 % (ref 36.0–46.0)
Hemoglobin: 12.2 g/dL (ref 12.0–15.0)
MCH: 28.4 pg (ref 26.0–34.0)
MCHC: 32.9 g/dL (ref 30.0–36.0)
MCV: 86.5 fL (ref 80.0–100.0)
Platelets: 189 10*3/uL (ref 150–400)
RBC: 4.29 MIL/uL (ref 3.87–5.11)
RDW: 13.2 % (ref 11.5–15.5)
WBC: 3.3 10*3/uL — ABNORMAL LOW (ref 4.0–10.5)
nRBC: 0 % (ref 0.0–0.2)

## 2024-01-27 LAB — GLUCOSE, CAPILLARY: Glucose-Capillary: 100 mg/dL — ABNORMAL HIGH (ref 70–99)

## 2024-01-27 LAB — PROTIME-INR
INR: 1.1 (ref 0.8–1.2)
Prothrombin Time: 14.5 s (ref 11.4–15.2)

## 2024-01-27 MED ORDER — LIDOCAINE HCL 1 % IJ SOLN
INTRAMUSCULAR | Status: AC
  Filled 2024-01-27: qty 20

## 2024-01-27 MED ORDER — VERAPAMIL HCL 2.5 MG/ML IV SOLN
INTRAVENOUS | Status: AC
  Filled 2024-01-27: qty 2

## 2024-01-27 MED ORDER — NITROGLYCERIN 1 MG/10 ML FOR IR/CATH LAB
INTRA_ARTERIAL | Status: AC
  Filled 2024-01-27: qty 10

## 2024-01-27 MED ORDER — MIDAZOLAM HCL 2 MG/2ML IJ SOLN
INTRAMUSCULAR | Status: AC | PRN
  Administered 2024-01-27: .5 mg via INTRAVENOUS
  Administered 2024-01-27: 1 mg via INTRAVENOUS
  Administered 2024-01-27: .5 mg via INTRAVENOUS

## 2024-01-27 MED ORDER — NITROGLYCERIN 1 MG/10 ML FOR IR/CATH LAB: INTRA_ARTERIAL | Status: AC | PRN

## 2024-01-27 MED ORDER — IOHEXOL 300 MG/ML  SOLN
150.0000 mL | Freq: Once | INTRAMUSCULAR | Status: AC | PRN
  Administered 2024-01-27: 40 mL via INTRA_ARTERIAL

## 2024-01-27 MED ORDER — HEPARIN SODIUM (PORCINE) 1000 UNIT/ML IJ SOLN
INTRAMUSCULAR | Status: AC
  Filled 2024-01-27: qty 10

## 2024-01-27 MED ORDER — IOHEXOL 300 MG/ML  SOLN
50.0000 mL | Freq: Once | INTRAMUSCULAR | Status: AC | PRN
  Administered 2024-01-27: 40 mL via INTRA_ARTERIAL

## 2024-01-27 MED ORDER — MIDAZOLAM HCL 2 MG/2ML IJ SOLN
INTRAMUSCULAR | Status: AC
  Filled 2024-01-27: qty 4

## 2024-01-27 MED ORDER — FENTANYL CITRATE (PF) 100 MCG/2ML IJ SOLN
INTRAMUSCULAR | Status: AC
  Filled 2024-01-27: qty 4

## 2024-01-27 MED ORDER — SODIUM CHLORIDE 0.9 % IV SOLN
INTRAVENOUS | Status: AC | PRN
  Administered 2024-01-27: 250 mL via INTRAVENOUS

## 2024-01-27 MED ORDER — SODIUM CHLORIDE 0.9 % IV SOLN: INTRAVENOUS | Status: AC

## 2024-01-27 MED ORDER — FENTANYL CITRATE (PF) 100 MCG/2ML IJ SOLN
INTRAMUSCULAR | Status: AC | PRN
  Administered 2024-01-27 (×4): 25 ug via INTRAVENOUS

## 2024-01-27 NOTE — Sedation Documentation (Signed)
 Doctor talking with patient and family member IR3

## 2024-01-27 NOTE — Discharge Instructions (Signed)
 Radial Site Care The following information offers guidance on how to care for yourself after your procedure. Your health care provider may also give you more specific instructions. If you have problems or questions, contact your health care provider. What can I expect after the procedure? After the procedure, it is common to have bruising and tenderness in the incision area. Follow these instructions at home: Incision site care  Follow instructions from your health care provider about how to take care of your incision site. Make sure you: Wash your hands with soap and water for at least 20 seconds before and after you change your bandage (dressing). If soap and water are not available, use hand sanitizer. Remove your dressing in 24 hours. Leave stitches (sutures), skin glue, or adhesive strips in place. These skin closures may need to stay in place for 2 weeks or longer. If adhesive strip edges start to loosen and curl up, you may trim the loose edges. Do not remove adhesive strips completely unless your health care provider tells you to do that. Do not take baths, swim, or use a hot tub for at least 1 week. You may shower 24 hours after the procedure or as told by your health care provider. Remove the dressing and gently wash the incision area with plain soap and water. Pat the area dry with a clean towel. Do not rub the site. That could cause bleeding. Do not apply powder or lotion to the site. Check your incision site every day for signs of infection. Check for: Redness, swelling, or pain. Fluid or blood. Warmth. Pus or a bad smell. Activity For 24 hours after the procedure, or as directed by your health care provider: Do not flex or bend the affected arm. Do not push or pull heavy objects with the affected arm. Do not operate machinery or power tools. Do not drive. You should not drive yourself home from the hospital or clinic if you go home during that time period. You may drive 24  hours after the procedure unless your health care provider tells you not to. Do not lift anything that is heavier than 10 lb (4.5 kg), or the limit that you are told, until your health care provider says that it is safe. Return to your normal activities as told by your health care provider. Ask your health care provider what activities are safe for you and when you can return to work. If you were given a sedative during the procedure, it can affect you for several hours. Do not drive or operate machinery until your health care provider says that it is safe. General instructions Take over-the-counter and prescription medicines only as told by your health care provider. If you will be going home right after the procedure, plan to have a responsible adult care for you for the time you are told. This is important. Keep all follow-up visits. This is important. Contact a health care provider if: You have a fever or chills. You have any of these signs of infection at your incision site: Redness, swelling, or pain. Fluid or blood. Warmth. Pus or a bad smell. Get help right away if: The incision area swells very fast. The incision area is bleeding, and the bleeding does not stop when you hold steady pressure on the area. Your arm or hand becomes pale, cool, tingly, or numb. These symptoms may represent a serious problem that is an emergency. Do not wait to see if the symptoms will go away. Get medical  help right away. Call your local emergency services (911 in the [place] Do not drive yourself to the hospital. Summary After the procedure, it is common to have bruising and tenderness at the incision site. Follow instructions from your health care provider about how to take care of your radial site incision. Check the incision every day for signs of infection. Do not lift anything that is heavier than 10 lb (4.5 kg), or the limit that you are told, until your health care provider says that it is  safe. Get help right away if the incision area swells very fast, you have bleeding at the incision site that will not stop, or your arm or hand becomes pale, cool, or numb. This information is not intended to replace advice given to you by your health care provider. Make sure you discuss any questions you have with your health care provider. Document Revised: 12/14/2020 Document Reviewed: 12/14/2020 Elsevier Patient Education  2024 [employer].

## 2024-01-27 NOTE — Procedures (Signed)
 INR.  Status post four-vessel cerebral arteriogram.  Right radial approach.  Findings.  1.  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  No immediate communication with the superior sagittal sinus in the posterior one third is evident.  The vascular malformation is fed by both occipital arteries.  Dural venous sinuses widely patent.  Fatima Sanger MD.

## 2024-02-01 ENCOUNTER — Other Ambulatory Visit (HOSPITAL_COMMUNITY): Payer: Self-pay | Admitting: Interventional Radiology

## 2024-02-01 ENCOUNTER — Encounter (HOSPITAL_COMMUNITY): Payer: Self-pay | Admitting: Interventional Radiology

## 2024-02-01 DIAGNOSIS — I671 Cerebral aneurysm, nonruptured: Secondary | ICD-10-CM

## 2024-02-08 ENCOUNTER — Ambulatory Visit (HOSPITAL_COMMUNITY)
Admission: RE | Admit: 2024-02-08 | Discharge: 2024-02-08 | Disposition: A | Discharge status: Home / Self Care | Source: Ambulatory Visit | Attending: Interventional Radiology | Admitting: Interventional Radiology

## 2024-02-08 DIAGNOSIS — I671 Cerebral aneurysm, nonruptured: Secondary | ICD-10-CM

## 2024-02-09 HISTORY — PX: IR RADIOLOGIST EVAL & MGMT: IMG5224

## 2024-02-16 ENCOUNTER — Telehealth (HOSPITAL_COMMUNITY): Payer: Self-pay

## 2024-02-16 NOTE — Telephone Encounter (Signed)
 Returned pt's call, no answer, left vm. AB

## 2024-03-07 ENCOUNTER — Telehealth (HOSPITAL_COMMUNITY): Payer: Self-pay | Admitting: Interventional Radiology

## 2024-03-07 NOTE — Telephone Encounter (Signed)
 Returned a call to patient. She states that she is having increasing and worsening symptoms. She states that her symptoms consist of left arm weakness from her armpit to her elbow, she gets winded easily, has sharp headaches that start in the front and move around to the back of her head where the mass is, lightheadedness, numbness/tingling in her fingers and toes with a chalky look to her digits, has ringing in her ears that is so loud that she cannot hear people talking to her that is worse on the right side, and she feels her heartbeat from her collar bone to the top of her head that is very strong and is worse behind her left ear. I relayed this information to the PA Quintin Buckle) to discuss with Deveshwar. Will contact patient with recommendations from Deveshwar. JM

## 2024-03-13 ENCOUNTER — Other Ambulatory Visit (HOSPITAL_COMMUNITY): Payer: Self-pay | Admitting: Interventional Radiology

## 2024-03-13 ENCOUNTER — Telehealth (HOSPITAL_COMMUNITY): Payer: Self-pay | Admitting: Interventional Radiology

## 2024-03-13 DIAGNOSIS — H93A9 Pulsatile tinnitus, unspecified ear: Secondary | ICD-10-CM

## 2024-03-13 NOTE — Telephone Encounter (Signed)
 Called pt, scheduled her to come in for consult to discuss surgery with Deveshwar JM

## 2024-03-14 ENCOUNTER — Ambulatory Visit (HOSPITAL_COMMUNITY)
Admission: RE | Admit: 2024-03-14 | Discharge: 2024-03-14 | Disposition: A | Discharge status: Home / Self Care | Source: Ambulatory Visit | Attending: Interventional Radiology | Admitting: Interventional Radiology

## 2024-03-14 DIAGNOSIS — R519 Headache, unspecified: Secondary | ICD-10-CM | POA: Diagnosis not present

## 2024-03-14 DIAGNOSIS — M542 Cervicalgia: Secondary | ICD-10-CM | POA: Diagnosis not present

## 2024-03-14 DIAGNOSIS — H93A2 Pulsatile tinnitus, left ear: Secondary | ICD-10-CM | POA: Diagnosis not present

## 2024-03-14 DIAGNOSIS — H93A9 Pulsatile tinnitus, unspecified ear: Secondary | ICD-10-CM

## 2024-03-15 HISTORY — PX: IR RADIOLOGIST EVAL & MGMT: IMG5224

## 2024-03-16 ENCOUNTER — Telehealth (HOSPITAL_COMMUNITY): Payer: Self-pay | Admitting: Interventional Radiology

## 2024-03-16 NOTE — Telephone Encounter (Signed)
 Pt called and states that per Dr. Alvira Josephs she is be put on the schedule for her procedure on Monday, May 12th and then go to [facility] to have the second part of her surgery with Dr. Albin Altes on Wednesday, May 14th. I informed her that I have called Dr Albin Altes at [facility] to arrange the procedures but he has not called me back yet to set this up. I told her I would contact Dr. Alvira Josephs now and call her right back with some information. She agreed to this plan of care. JM

## 2024-03-21 ENCOUNTER — Telehealth (HOSPITAL_COMMUNITY): Payer: Self-pay | Admitting: Interventional Radiology

## 2024-03-21 NOTE — Telephone Encounter (Signed)
 Spoke with Dr. Albin Altes this morning to arrange surgery with him at [facility] to follow embolization surgery with Dr. Alvira Josephs at [facility]. The two procedures with run within a couple of days of one another. Deveshwar will perform embolization first and then [facility] with perform surgery 2 days after. Dr. Albin Altes with contact Mrs. Ayer on 5/15 for a phone or in person visit to discuss. Once he has spoken with the patient he will send me the surgery date/plan. I will then put her on for Deveshwar. We are planning for surgical interventions next week. I called and gave the patient all of this information and she is in agreement with this plan of care. JM

## 2024-03-22 DIAGNOSIS — Z98891 History of uterine scar from previous surgery: Secondary | ICD-10-CM | POA: Insufficient documentation

## 2024-03-22 DIAGNOSIS — Q2730 Arteriovenous malformation, site unspecified: Secondary | ICD-10-CM | POA: Diagnosis not present

## 2024-03-22 DIAGNOSIS — N83299 Other ovarian cyst, unspecified side: Secondary | ICD-10-CM | POA: Insufficient documentation

## 2024-03-27 ENCOUNTER — Other Ambulatory Visit (HOSPITAL_COMMUNITY): Payer: Self-pay | Admitting: Interventional Radiology

## 2024-03-27 DIAGNOSIS — H93A9 Pulsatile tinnitus, unspecified ear: Secondary | ICD-10-CM

## 2024-03-27 DIAGNOSIS — Q282 Arteriovenous malformation of cerebral vessels: Secondary | ICD-10-CM

## 2024-03-29 ENCOUNTER — Ambulatory Visit (HOSPITAL_BASED_OUTPATIENT_CLINIC_OR_DEPARTMENT_OTHER)
Admission: RE | Admit: 2024-03-29 | Discharge: 2024-03-29 | Disposition: A | Discharge status: Home / Self Care | Source: Ambulatory Visit | Attending: Family Medicine | Admitting: Family Medicine

## 2024-03-29 ENCOUNTER — Encounter (HOSPITAL_BASED_OUTPATIENT_CLINIC_OR_DEPARTMENT_OTHER): Payer: Self-pay

## 2024-03-29 VITALS — BP 110/75 | HR 90 | Temp 98.5°F | Resp 18

## 2024-03-29 DIAGNOSIS — Q2730 Arteriovenous malformation, site unspecified: Secondary | ICD-10-CM | POA: Diagnosis not present

## 2024-03-29 DIAGNOSIS — M79671 Pain in right foot: Secondary | ICD-10-CM | POA: Diagnosis not present

## 2024-03-29 DIAGNOSIS — Z01818 Encounter for other preprocedural examination: Secondary | ICD-10-CM | POA: Diagnosis not present

## 2024-03-29 DIAGNOSIS — L03115 Cellulitis of right lower limb: Secondary | ICD-10-CM | POA: Diagnosis not present

## 2024-03-29 MED ORDER — CLINDAMYCIN HCL 300 MG PO CAPS
300.0000 mg | ORAL_CAPSULE | Freq: Three times a day (TID) | ORAL | 0 refills | Status: AC
Start: 2024-03-29 — End: 2024-04-05

## 2024-03-29 MED ORDER — MUPIROCIN 2 % EX OINT: 1.0000 | TOPICAL_OINTMENT | Freq: Two times a day (BID) | CUTANEOUS | 0 refills | Status: DC

## 2024-03-29 NOTE — Discharge Instructions (Signed)
 History and exam of right foot are most consistent with cellulitis of the foot secondary to insect bite approximately a week ago.  Clean the foot with warm soapy water , rinse, pat dry, apply mupirocin ointment to the bite site and use a bandage if needed.  A thin amount of mupirocin is all that is needed.  May continue Benadryl  or even cetirizine or levocetirizine or loratadine daily for any kind of allergic reaction associated with this bite or sting.  Use clindamycin, 300 mg, 1 pill 3 times daily with food for cellulitis.  Take probiotics or eat yogurt while on the antibiotics and for a week after the antibiotics are completed to prevent antibiotic related diarrhea or vaginal yeast infection.  Follow-up if symptoms do not completely resolve, worsen or if new symptoms occur.
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
## 2024-03-29 NOTE — ED Triage Notes (Signed)
 Pt reports she was stung by a bee a week ago and her right foot swelled up she took benadryl  for a couple days swelling went down but she noticed yesterday her foot was red so she marked with a pen the redness, she ddi take another benadryl  and redness has improved some,pt thinks she might have cellulitis now.

## 2024-04-03 ENCOUNTER — Ambulatory Visit (INDEPENDENT_AMBULATORY_CARE_PROVIDER_SITE_OTHER): Payer: Self-pay | Admitting: Family Medicine

## 2024-04-03 VITALS — BP 109/69 | HR 67 | Temp 97.9°F | Resp 16 | Ht 67.72 in | Wt 138.0 lb

## 2024-04-03 DIAGNOSIS — F419 Anxiety disorder, unspecified: Secondary | ICD-10-CM | POA: Insufficient documentation

## 2024-04-03 DIAGNOSIS — Q2730 Arteriovenous malformation, site unspecified: Secondary | ICD-10-CM

## 2024-04-03 NOTE — Assessment & Plan Note (Signed)
 Continue reviewing old records.  F/u with [facility] as directed.  Request additional records.  Her GYN is addressing numerous health maintenance issues, but I'm happy to be available to her as needed, especially if she eventually opts for an anxiolytic, etc.
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
## 2024-04-04 ENCOUNTER — Telehealth (HOSPITAL_COMMUNITY): Payer: Self-pay | Admitting: Interventional Radiology

## 2024-04-04 NOTE — Telephone Encounter (Signed)
 Called pt, left VM letting her know that per Dr. Alvira Josephs she will NOT need to start on any blood thinners prior to her upcoming procedure on 04/09/24. JM

## 2024-04-06 ENCOUNTER — Other Ambulatory Visit: Payer: Self-pay

## 2024-04-06 ENCOUNTER — Encounter (HOSPITAL_COMMUNITY): Payer: Self-pay | Admitting: Interventional Radiology

## 2024-04-06 ENCOUNTER — Other Ambulatory Visit: Payer: Self-pay | Admitting: Radiology

## 2024-04-06 DIAGNOSIS — Q2730 Arteriovenous malformation, site unspecified: Secondary | ICD-10-CM

## 2024-04-06 NOTE — Progress Notes (Signed)
 PCP - Madelyne Schiff, MD  Cardiologist -   PPM/ICD - denies Device Orders - n/a Rep Notified - n/a  Chest x-ray - denies EKG - denies Stress Test - denies ECHO - denies Cardiac Cath - denies  CPAP - denies  DM -denies  Blood Thinner Instructions: denies Aspirin Instructions: n/a  ERAS Protcol - NPO  COVID TEST- n/a  Anesthesia review: no  Patient verbally denies any shortness of breath, fever, cough and chest pain during phone call   -------------  SDW INSTRUCTIONS given:  Your procedure is scheduled on April 13, 2024.  Report to [facility] Main Entrance "A" at 5:45 A.M., and check in at the Admitting office.  Call this number if you have problems the morning of surgery:  [phone]   Remember:  Do not eat or drink after midnight the night before your surgery      Take these medicines the morning of surgery with A SIP OF WATER   acetaminophen  (TYLENOL )    As of today, STOP taking any Aspirin (unless otherwise instructed by your surgeon) Aleve, Naproxen, Ibuprofen , Motrin , Advil , Goody's, BC's, all herbal medications, fish oil, and all vitamins.                      Do not wear jewelry, make up, or nail polish            Do not wear lotions, powders, perfumes/colognes, or deodorant.            Do not shave 48 hours prior to surgery.  Men may shave face and neck.            Do not bring valuables to the hospital.            [facility] is not responsible for any belongings or valuables.  Do NOT Smoke (Tobacco/Vaping) 24 hours prior to your procedure If you use a CPAP at night, you may bring all equipment for your overnight stay.   Contacts, glasses, dentures or bridgework may not be worn into surgery.      For patients admitted to the hospital, discharge time will be determined by your treatment team.   Patients discharged the day of surgery will not be allowed to drive home, and someone needs to stay with them for 24 hours.    Special instructions:    [facility]- Preparing For Surgery  Before surgery, you can play an important role. Because skin is not sterile, your skin needs to be as free of germs as possible. You can reduce the number of germs on your skin by washing with CHG (chlorahexidine gluconate) Soap before surgery.  CHG is an antiseptic cleaner which kills germs and bonds with the skin to continue killing germs even after washing.    Oral Hygiene is also important to reduce your risk of infection.  Remember - BRUSH YOUR TEETH THE MORNING OF SURGERY WITH YOUR REGULAR TOOTHPASTE  Please do not use if you have an allergy to CHG or antibacterial soaps. If your skin becomes reddened/irritated stop using the CHG.  Do not shave (including legs and underarms) for at least 48 hours prior to first CHG shower. It is OK to shave your face.  Please follow these instructions carefully.   Shower the NIGHT BEFORE SURGERY and the MORNING OF SURGERY with DIAL Soap.   Pat yourself dry with a CLEAN TOWEL.  Wear CLEAN PAJAMAS to bed the night before surgery  Place CLEAN SHEETS on  your bed the night of your first shower and DO NOT SLEEP WITH PETS.   Day of Surgery: Please shower morning of surgery  Wear Clean/Comfortable clothing the morning of surgery Do not apply any deodorants/lotions.   Remember to brush your teeth WITH YOUR REGULAR TOOTHPASTE.   Questions were answered. Patient verbalized understanding of instructions.
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
## 2024-04-09 ENCOUNTER — Inpatient Hospital Stay (HOSPITAL_COMMUNITY)
Admission: RE | Admit: 2024-04-09 | Discharge: 2024-04-10 | DRG: 027 | Disposition: A | Discharge status: Home / Self Care | Payer: Self-pay | Attending: Interventional Radiology | Admitting: Interventional Radiology

## 2024-04-09 ENCOUNTER — Ambulatory Visit (HOSPITAL_COMMUNITY)

## 2024-04-09 ENCOUNTER — Other Ambulatory Visit: Payer: Self-pay

## 2024-04-09 ENCOUNTER — Ambulatory Visit (HOSPITAL_COMMUNITY)
Admission: RE | Admit: 2024-04-09 | Discharge: 2024-04-09 | Disposition: A | Discharge status: Home / Self Care | Payer: Self-pay | Source: Ambulatory Visit | Attending: Interventional Radiology | Admitting: Interventional Radiology

## 2024-04-09 ENCOUNTER — Encounter (HOSPITAL_COMMUNITY)
Admission: RE | Disposition: A | Discharge status: Home / Self Care | Payer: Self-pay | Source: Home / Self Care | Attending: Interventional Radiology

## 2024-04-09 ENCOUNTER — Encounter (HOSPITAL_COMMUNITY): Payer: Self-pay | Admitting: Interventional Radiology

## 2024-04-09 DIAGNOSIS — Z818 Family history of other mental and behavioral disorders: Secondary | ICD-10-CM | POA: Diagnosis not present

## 2024-04-09 DIAGNOSIS — Z8679 Personal history of other diseases of the circulatory system: Secondary | ICD-10-CM

## 2024-04-09 DIAGNOSIS — F32A Depression, unspecified: Secondary | ICD-10-CM | POA: Insufficient documentation

## 2024-04-09 DIAGNOSIS — H93A9 Pulsatile tinnitus, unspecified ear: Secondary | ICD-10-CM | POA: Diagnosis not present

## 2024-04-09 DIAGNOSIS — Q2730 Arteriovenous malformation, site unspecified: Secondary | ICD-10-CM

## 2024-04-09 DIAGNOSIS — Z8619 Personal history of other infectious and parasitic diseases: Secondary | ICD-10-CM

## 2024-04-09 DIAGNOSIS — Z87891 Personal history of nicotine dependence: Secondary | ICD-10-CM

## 2024-04-09 DIAGNOSIS — F419 Anxiety disorder, unspecified: Secondary | ICD-10-CM | POA: Diagnosis not present

## 2024-04-09 DIAGNOSIS — Q282 Arteriovenous malformation of cerebral vessels: Principal | ICD-10-CM

## 2024-04-09 DIAGNOSIS — I63532 Cerebral infarction due to unspecified occlusion or stenosis of left posterior cerebral artery: Secondary | ICD-10-CM | POA: Diagnosis not present

## 2024-04-09 DIAGNOSIS — Z9889 Other specified postprocedural states: Principal | ICD-10-CM

## 2024-04-09 DIAGNOSIS — Z98891 History of uterine scar from previous surgery: Secondary | ICD-10-CM | POA: Diagnosis not present

## 2024-04-09 DIAGNOSIS — Z62810 Personal history of physical and sexual abuse in childhood: Secondary | ICD-10-CM

## 2024-04-09 DIAGNOSIS — K219 Gastro-esophageal reflux disease without esophagitis: Secondary | ICD-10-CM | POA: Diagnosis not present

## 2024-04-09 DIAGNOSIS — Z87442 Personal history of urinary calculi: Secondary | ICD-10-CM

## 2024-04-09 HISTORY — PX: IR NEURO EACH ADD'L AFTER BASIC UNI LEFT (MS): IMG5373

## 2024-04-09 HISTORY — PX: IR ANGIO EXTERNAL CAROTID SEL EXT CAROTID UNI L MOD SED: IMG5370

## 2024-04-09 HISTORY — PX: IR ANGIOGRAM FOLLOW UP STUDY: IMG697

## 2024-04-09 HISTORY — PX: IR ANGIO INTRA EXTRACRAN SEL COM CAROTID INNOMINATE BILAT MOD SED: IMG5360

## 2024-04-09 HISTORY — PX: RADIOLOGY WITH ANESTHESIA: SHX6223

## 2024-04-09 HISTORY — PX: IR TRANSCATH/EMBOLIZ: IMG695

## 2024-04-09 HISTORY — DX: Personal history of urinary calculi: Z87.442

## 2024-04-09 LAB — CBC
HCT: 39.2 % (ref 36.0–46.0)
Hemoglobin: 12.4 g/dL (ref 12.0–15.0)
MCH: 27.7 pg (ref 26.0–34.0)
MCHC: 31.6 g/dL (ref 30.0–36.0)
MCV: 87.7 fL (ref 80.0–100.0)
Platelets: 215 10*3/uL (ref 150–400)
RBC: 4.47 MIL/uL (ref 3.87–5.11)
RDW: 13.9 % (ref 11.5–15.5)
WBC: 4.8 10*3/uL (ref 4.0–10.5)
nRBC: 0 % (ref 0.0–0.2)

## 2024-04-09 LAB — POCT PREGNANCY, URINE: Preg Test, Ur: NEGATIVE

## 2024-04-09 LAB — PROTIME-INR
INR: 1 (ref 0.8–1.2)
Prothrombin Time: 13.3 s (ref 11.4–15.2)

## 2024-04-09 LAB — TYPE AND SCREEN
ABO/RH(D): O POS
Antibody Screen: NEGATIVE

## 2024-04-09 LAB — BASIC METABOLIC PANEL WITH GFR
Anion gap: 7 (ref 5–15)
BUN: 10 mg/dL (ref 6–20)
CO2: 24 mmol/L (ref 22–32)
Calcium: 8.7 mg/dL — ABNORMAL LOW (ref 8.9–10.3)
Chloride: 106 mmol/L (ref 98–111)
Creatinine, Ser: 0.69 mg/dL (ref 0.44–1.00)
GFR, Estimated: >60 mL/min (ref >60)
Glucose, Bld: 97 mg/dL (ref 70–99)
Potassium: 3.8 mmol/L (ref 3.5–5.1)
Sodium: 137 mmol/L (ref 135–145)

## 2024-04-09 LAB — POCT ACTIVATED CLOTTING TIME
Activated Clotting Time: 239 s
Activated Clotting Time: 245 s

## 2024-04-09 SURGERY — RADIOLOGY WITH ANESTHESIA
Anesthesia: General

## 2024-04-09 MED ORDER — SODIUM CHLORIDE 0.9 % IV SOLN
INTRAVENOUS | Status: DC
Start: 2024-04-09 — End: 2024-04-09

## 2024-04-09 MED ORDER — EPHEDRINE SULFATE-NACL 50-0.9 MG/10ML-% IV SOSY
PREFILLED_SYRINGE | INTRAVENOUS | Status: DC | PRN
  Administered 2024-04-09 (×2): 5 mg via INTRAVENOUS

## 2024-04-09 MED ORDER — GLYCOPYRROLATE PF 0.2 MG/ML IJ SOSY
PREFILLED_SYRINGE | INTRAMUSCULAR | Status: DC | PRN
Start: 2024-04-09 — End: 2024-04-09
  Administered 2024-04-09: .2 mg via INTRAVENOUS

## 2024-04-09 MED ORDER — PHENYLEPHRINE 80 MCG/ML (10ML) SYRINGE FOR IV PUSH (FOR BLOOD PRESSURE SUPPORT)
PREFILLED_SYRINGE | INTRAVENOUS | Status: DC | PRN
  Administered 2024-04-09 (×2): 40 ug via INTRAVENOUS
  Administered 2024-04-09: 80 ug via INTRAVENOUS
  Administered 2024-04-09: 120 ug via INTRAVENOUS
  Administered 2024-04-09: 40 ug via INTRAVENOUS
  Administered 2024-04-09: 120 ug via INTRAVENOUS
  Administered 2024-04-09 (×4): 40 ug via INTRAVENOUS
  Administered 2024-04-09: 160 ug via INTRAVENOUS

## 2024-04-09 MED ORDER — IOHEXOL 300 MG/ML  SOLN
150.0000 mL | Freq: Once | INTRAMUSCULAR | Status: AC | PRN
  Administered 2024-04-09: 150 mL via INTRA_ARTERIAL

## 2024-04-09 MED ORDER — CHLORHEXIDINE GLUCONATE CLOTH 2 % EX PADS
6.0000 | MEDICATED_PAD | Freq: Every day | CUTANEOUS | Status: DC
  Administered 2024-04-09: 6 via TOPICAL

## 2024-04-09 MED ORDER — ORAL CARE MOUTH RINSE: 15.0000 mL | Freq: Once | OROMUCOSAL | Status: AC

## 2024-04-09 MED ORDER — NITROGLYCERIN 1 MG/10 ML FOR IR/CATH LAB
INTRA_ARTERIAL | Status: AC
  Filled 2024-04-09: qty 10

## 2024-04-09 MED ORDER — PHENYLEPHRINE HCL-NACL 20-0.9 MG/250ML-% IV SOLN
INTRAVENOUS | Status: DC | PRN
  Administered 2024-04-09: 30 ug/min via INTRAVENOUS
  Administered 2024-04-09: 10 ug/min via INTRAVENOUS
  Administered 2024-04-09: 20 ug/min via INTRAVENOUS

## 2024-04-09 MED ORDER — HEPARIN SODIUM (PORCINE) 1000 UNIT/ML IJ SOLN
INTRAMUSCULAR | Status: DC | PRN
  Administered 2024-04-09: 1000 [IU] via INTRAVENOUS
  Administered 2024-04-09: 3000 [IU] via INTRAVENOUS

## 2024-04-09 MED ORDER — LIDOCAINE HCL 1 % IJ SOLN
INTRAMUSCULAR | Status: AC
  Filled 2024-04-09: qty 20

## 2024-04-09 MED ORDER — OXYCODONE HCL 5 MG/5ML PO SOLN: 5.0000 mg | Freq: Once | ORAL | Status: DC | PRN

## 2024-04-09 MED ORDER — CHLORHEXIDINE GLUCONATE 0.12 % MT SOLN: 15.0000 mL | Freq: Once | OROMUCOSAL | Status: AC

## 2024-04-09 MED ORDER — FENTANYL CITRATE (PF) 250 MCG/5ML IJ SOLN
INTRAMUSCULAR | Status: AC
Start: 2024-04-09 — End: ?
  Filled 2024-04-09: qty 5

## 2024-04-09 MED ORDER — SODIUM CHLORIDE (PF) 0.9 % IJ SOLN
INTRAVENOUS | Status: AC | PRN
  Administered 2024-04-09: 25 ug via INTRA_ARTERIAL

## 2024-04-09 MED ORDER — SODIUM CHLORIDE 0.9 % IV SOLN: INTRAVENOUS | Status: DC

## 2024-04-09 MED ORDER — ROCURONIUM BROMIDE 10 MG/ML (PF) SYRINGE
PREFILLED_SYRINGE | INTRAVENOUS | Status: DC | PRN
  Administered 2024-04-09: 50 mg via INTRAVENOUS
  Administered 2024-04-09: 10 mg via INTRAVENOUS
  Administered 2024-04-09: 20 mg via INTRAVENOUS
  Administered 2024-04-09: 10 mg via INTRAVENOUS

## 2024-04-09 MED ORDER — CLEVIDIPINE BUTYRATE 0.5 MG/ML IV EMUL: 0.0000 mg/h | INTRAVENOUS | Status: AC

## 2024-04-09 MED ORDER — SUGAMMADEX SODIUM 200 MG/2ML IV SOLN
INTRAVENOUS | Status: DC | PRN
  Administered 2024-04-09: 200 mg via INTRAVENOUS

## 2024-04-09 MED ORDER — ACETAMINOPHEN 325 MG PO TABS
650.0000 mg | ORAL_TABLET | ORAL | Status: DC | PRN
  Administered 2024-04-09: 650 mg via ORAL
  Filled 2024-04-09: qty 2

## 2024-04-09 MED ORDER — FENTANYL CITRATE (PF) 100 MCG/2ML IJ SOLN: 25.0000 ug | INTRAMUSCULAR | Status: DC | PRN

## 2024-04-09 MED ORDER — ACETAMINOPHEN 650 MG RE SUPP: 650.0000 mg | RECTAL | Status: DC | PRN

## 2024-04-09 MED ORDER — ONDANSETRON HCL 4 MG/2ML IJ SOLN: 4.0000 mg | Freq: Four times a day (QID) | INTRAMUSCULAR | Status: DC | PRN

## 2024-04-09 MED ORDER — CHLORHEXIDINE GLUCONATE 0.12 % MT SOLN
OROMUCOSAL | Status: AC
  Administered 2024-04-09: 15 mL via OROMUCOSAL
  Filled 2024-04-09: qty 15

## 2024-04-09 MED ORDER — LIDOCAINE HCL 1 % IJ SOLN
8.0000 mL | Freq: Once | INTRAMUSCULAR | Status: AC
  Administered 2024-04-09: 8 mL via INTRADERMAL

## 2024-04-09 MED ORDER — CHLORHEXIDINE GLUCONATE CLOTH 2 % EX PADS: 6.0000 | MEDICATED_PAD | Freq: Every day | CUTANEOUS | Status: DC

## 2024-04-09 MED ORDER — ACETAMINOPHEN 160 MG/5ML PO SOLN
650.0000 mg | ORAL | Status: DC | PRN
Start: 2024-04-09 — End: 2024-04-10

## 2024-04-09 MED ORDER — CEFAZOLIN SODIUM-DEXTROSE 2-3 GM-%(50ML) IV SOLR
INTRAVENOUS | Status: DC | PRN
  Administered 2024-04-09: 2 g via INTRAVENOUS

## 2024-04-09 MED ORDER — PROTAMINE SULFATE 10 MG/ML IV SOLN
INTRAVENOUS | Status: DC | PRN
  Administered 2024-04-09: 5 mg via INTRAVENOUS

## 2024-04-09 MED ORDER — LIDOCAINE 2% (20 MG/ML) 5 ML SYRINGE
INTRAMUSCULAR | Status: DC | PRN
  Administered 2024-04-09: 40 mg via INTRAVENOUS

## 2024-04-09 MED ORDER — MIDAZOLAM HCL 2 MG/2ML IJ SOLN
INTRAMUSCULAR | Status: AC
Start: 2024-04-09 — End: ?
  Filled 2024-04-09: qty 2

## 2024-04-09 MED ORDER — DEXAMETHASONE SODIUM PHOSPHATE 10 MG/ML IJ SOLN
INTRAMUSCULAR | Status: DC | PRN
  Administered 2024-04-09: 10 mg via INTRAVENOUS

## 2024-04-09 MED ORDER — OXYCODONE HCL 5 MG PO TABS: 5.0000 mg | ORAL_TABLET | Freq: Once | ORAL | Status: DC | PRN

## 2024-04-09 MED ORDER — FENTANYL CITRATE (PF) 250 MCG/5ML IJ SOLN
INTRAMUSCULAR | Status: DC | PRN
  Administered 2024-04-09: 50 ug via INTRAVENOUS
  Administered 2024-04-09 (×2): 25 ug via INTRAVENOUS
  Administered 2024-04-09: 100 ug via INTRAVENOUS
  Administered 2024-04-09: 50 ug via INTRAVENOUS

## 2024-04-09 MED ORDER — IOHEXOL 300 MG/ML  SOLN
50.0000 mL | Freq: Once | INTRAMUSCULAR | Status: AC | PRN
  Administered 2024-04-09: 50 mL via INTRA_ARTERIAL

## 2024-04-09 MED ORDER — ONDANSETRON HCL 4 MG/2ML IJ SOLN
INTRAMUSCULAR | Status: DC | PRN
  Administered 2024-04-09: 4 mg via INTRAVENOUS

## 2024-04-09 MED ORDER — PROPOFOL 10 MG/ML IV BOLUS
INTRAVENOUS | Status: DC | PRN
  Administered 2024-04-09: 50 mg via INTRAVENOUS
  Administered 2024-04-09: 150 mg via INTRAVENOUS

## 2024-04-09 MED ORDER — CEFAZOLIN SODIUM-DEXTROSE 2-4 GM/100ML-% IV SOLN
INTRAVENOUS | Status: AC
  Filled 2024-04-09: qty 100

## 2024-04-09 MED ORDER — MIDAZOLAM HCL 2 MG/2ML IJ SOLN
INTRAMUSCULAR | Status: DC | PRN
  Administered 2024-04-09: 2 mg via INTRAVENOUS
  Administered 2024-04-09: 1.5 mg via INTRAVENOUS

## 2024-04-09 NOTE — Transfer of Care (Signed)
 Immediate Anesthesia Transfer of Care Note  Patient: Wendy Potter  Procedure(s) Performed: RADIOLOGY WITH ANESTHESIA  Patient Location: PACU  Anesthesia Type:General  Level of Consciousness: awake, alert , and oriented  Airway & Oxygen Therapy: Patient Spontanous Breathing and Patient connected to nasal cannula oxygen  Post-op Assessment: Report given to RN and Post -op Vital signs reviewed and stable  Post vital signs: Reviewed and stable  Last Vitals:  Vitals Value Taken Time  BP 120/95 04/09/24 1136  Temp 36.4 C 04/09/24 1136  Pulse 80 04/09/24 1142  Resp 12 04/09/24 1142  SpO2 99 % 04/09/24 1142  Vitals shown include unfiled device data.  Last Pain:  Vitals:   04/09/24 1136  TempSrc:   PainSc: 0-No pain         Complications: No notable events documented.

## 2024-04-09 NOTE — Procedures (Signed)
 INR.  Status post bilateral common carotid arteriograms and left external carotid arteriogram.  Status post complete occlusion of the dominant left occipital artery supplying the scalp arteriovenous malformation using multiple coils to complete occlusion.  A small side branch arising from the mid left occipital artery seen to partially supply a small part of the scalp malformation posteriorly.  Additional small supply from the right occipital artery  supplying the inferior aspect of the scalp malformation.  Manual compression held at the right groin puncture site with a clot for hemostasis.  Distal pulses all intact.  An approximately 5 to 6 mm  LT ICA superior hypophyseal region aneurysm remains unchanged.  Patient extubated.  Denies any headaches nausea vomiting.  Appropriately responsive.  Pupils 2 to 3 mm sluggishly reactive bilaterally.  No facial asymmetry.  Tongue midline.  Patient moves all 4 spontaneously and to command equally.  Jory Ng MD.

## 2024-04-09 NOTE — Anesthesia Procedure Notes (Signed)
 Procedure Name: Intubation Date/Time: 04/09/2024 8:42 AM  Performed by: Candance Certain, CRNAPre-anesthesia Checklist: Patient identified, Emergency Drugs available, Suction available and Patient being monitored Patient Re-evaluated:Patient Re-evaluated prior to induction Oxygen Delivery Method: Circle System Utilized Preoxygenation: Pre-oxygenation with 100% oxygen Induction Type: IV induction Ventilation: Mask ventilation without difficulty Laryngoscope Size: Mac and 3 Grade View: Grade I Tube type: Oral Tube size: 7.0 mm Number of attempts: 1 Airway Equipment and Method: Stylet and Oral airway Placement Confirmation: ETT inserted through vocal cords under direct vision, positive ETCO2 and breath sounds checked- equal and bilateral Secured at: 21 cm Tube secured with: Tape Dental Injury: Injury to lip  Comments: Small cut to lower R side of lip. Dentition as per preop.

## 2024-04-09 NOTE — Plan of Care (Signed)
  Problem: Clinical Measurements: Goal: Will remain free from infection Outcome: Progressing Goal: Diagnostic test results will improve Outcome: Progressing Goal: Respiratory complications will improve Outcome: Progressing Goal: Cardiovascular complication will be avoided Outcome: Progressing   Problem: Nutrition: Goal: Adequate nutrition will be maintained Outcome: Progressing   Problem: Coping: Goal: Level of anxiety will decrease Outcome: Progressing   Problem: Pain Managment: Goal: General experience of comfort will improve and/or be controlled Outcome: Progressing   Problem: Safety: Goal: Ability to remain free from injury will improve Outcome: Progressing   Problem: Skin Integrity: Goal: Risk for impaired skin integrity will decrease Outcome: Progressing

## 2024-04-09 NOTE — Anesthesia Preprocedure Evaluation (Signed)
 Anesthesia Evaluation  Patient identified by MRN, date of birth, ID band Patient awake    Reviewed: Allergy & Precautions, H&P , NPO status , Patient's Chart, lab work & pertinent test results  Airway Mallampati: II   Neck ROM: full    Dental   Pulmonary former smoker   breath sounds clear to auscultation       Cardiovascular negative cardio ROS  Rhythm:regular Rate:Normal     Neuro/Psych  PSYCHIATRIC DISORDERS Anxiety Depression       GI/Hepatic ,GERD  ,,  Endo/Other    Renal/GU stones     Musculoskeletal   Abdominal   Peds  Hematology   Anesthesia Other Findings   Reproductive/Obstetrics                             Anesthesia Physical Anesthesia Plan  ASA: 2  Anesthesia Plan: General   Post-op Pain Management:    Induction: Intravenous  PONV Risk Score and Plan: 3 and Ondansetron , Dexamethasone , Midazolam  and Treatment may vary due to age or medical condition  Airway Management Planned: Oral ETT  Additional Equipment: Arterial line  Intra-op Plan:   Post-operative Plan: Extubation in OR  Informed Consent: I have reviewed the patients History and Physical, chart, labs and discussed the procedure including the risks, benefits and alternatives for the proposed anesthesia with the patient or authorized representative who has indicated his/her understanding and acceptance.     Dental advisory given  Plan Discussed with: CRNA, Anesthesiologist and Surgeon  Anesthesia Plan Comments:        Anesthesia Quick Evaluation

## 2024-04-09 NOTE — Progress Notes (Signed)
 Patient seen in 4N with Dr. Alvira Josephs, no concerns noted - feels improved already and looking forward to undergoing further procedures at [facility] on Wednesday.   Right CFA puncture site mildly tender to deep palpation, soft, no active bleeding or significant bruising. Per patient and RN right foot feeling warmer now, typically has very cold feet at baseline. Does not smoke, no known peripheral vascular issues. Right foot pulses dopplerable, left foot pulses palpable. Sensation in tact BLE. Remainder of neuro exam unremarkable.  Plan: - Strict bedrest until tomorrow morning (6/3) - Right leg to remain straight, may elevate HOB - NIR will follow up tomorrow AM for likely discharge  Please call Dr. Alvira Josephs overnight with any concerns.  Nathan Bake, PA-C
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
## 2024-04-09 NOTE — Sedation Documentation (Addendum)
 1006 ACT: 239 - Dr. Alvira Josephs notified 1058 ACT: 245 Dr. Alvira Josephs notified

## 2024-04-09 NOTE — Anesthesia Procedure Notes (Signed)
 Arterial Line Insertion Start/End6/12/2023 7:08 AM, 04/09/2024 7:18 AM Performed by: Ellena Gurney, MD, Candance Certain, CRNA, CRNA  Preanesthetic checklist: patient identified, IV checked, site marked, risks and benefits discussed, surgical consent, monitors and equipment checked, pre-op evaluation, timeout performed and anesthesia consent Lidocaine  1% used for infiltration Left, radial was placed Catheter size: 20 G Hand hygiene performed , maximum sterile barriers used  and Seldinger technique used Allen's test indicative of satisfactory collateral circulation Attempts: 1 Procedure performed using ultrasound guided technique. Ultrasound Notes:anatomy identified, needle tip was noted to be adjacent to the nerve/plexus identified and no ultrasound evidence of intravascular and/or intraneural injection Following insertion, Biopatch. Post procedure assessment: normal  Patient tolerated the procedure well with no immediate complications.

## 2024-04-09 NOTE — H&P (Deleted)
   The note originally documented on this encounter has been moved the the encounter in which it belongs.

## 2024-04-10 ENCOUNTER — Encounter (HOSPITAL_COMMUNITY): Payer: Self-pay | Admitting: Interventional Radiology

## 2024-04-10 LAB — BASIC METABOLIC PANEL WITH GFR
Anion gap: 6 (ref 5–15)
BUN: 6 mg/dL (ref 6–20)
CO2: 21 mmol/L — ABNORMAL LOW (ref 22–32)
Calcium: 7.8 mg/dL — ABNORMAL LOW (ref 8.9–10.3)
Chloride: 112 mmol/L — ABNORMAL HIGH (ref 98–111)
Creatinine, Ser: 0.61 mg/dL (ref 0.44–1.00)
GFR, Estimated: >60 mL/min (ref >60)
Glucose, Bld: 86 mg/dL (ref 70–99)
Potassium: 3.7 mmol/L (ref 3.5–5.1)
Sodium: 139 mmol/L (ref 135–145)

## 2024-04-10 LAB — CBC WITH DIFFERENTIAL/PLATELET
Abs Immature Granulocytes: 0.02 10*3/uL (ref 0.00–0.07)
Basophils Absolute: 0 10*3/uL (ref 0.0–0.1)
Basophils Relative: 0 %
Eosinophils Absolute: 0 10*3/uL (ref 0.0–0.5)
Eosinophils Relative: 0 %
HCT: 30.3 % — ABNORMAL LOW (ref 36.0–46.0)
Hemoglobin: 9.7 g/dL — ABNORMAL LOW (ref 12.0–15.0)
Immature Granulocytes: 0 %
Lymphocytes Relative: 32 %
Lymphs Abs: 1.8 10*3/uL (ref 0.7–4.0)
MCH: 27.6 pg (ref 26.0–34.0)
MCHC: 32 g/dL (ref 30.0–36.0)
MCV: 86.3 fL (ref 80.0–100.0)
Monocytes Absolute: 0.3 10*3/uL (ref 0.1–1.0)
Monocytes Relative: 6 %
Neutro Abs: 3.4 10*3/uL (ref 1.7–7.7)
Neutrophils Relative %: 62 %
Platelets: 171 10*3/uL (ref 150–400)
RBC: 3.51 MIL/uL — ABNORMAL LOW (ref 3.87–5.11)
RDW: 14.2 % (ref 11.5–15.5)
WBC: 5.5 10*3/uL (ref 4.0–10.5)
nRBC: 0 % (ref 0.0–0.2)

## 2024-04-10 NOTE — Plan of Care (Signed)
  Problem: Clinical Measurements: Goal: Ability to maintain clinical measurements within normal limits will improve Outcome: Progressing Goal: Will remain free from infection Outcome: Progressing Goal: Diagnostic test results will improve Outcome: Progressing Goal: Respiratory complications will improve Outcome: Progressing Goal: Cardiovascular complication will be avoided Outcome: Progressing   Problem: Activity: Goal: Risk for activity intolerance will decrease Outcome: Progressing   Problem: Nutrition: Goal: Adequate nutrition will be maintained Outcome: Progressing   Problem: Elimination: Goal: Will not experience complications related to urinary retention Outcome: Progressing   Problem: Pain Managment: Goal: General experience of comfort will improve and/or be controlled Outcome: Progressing   Problem: Safety: Goal: Ability to remain free from injury will improve Outcome: Progressing   Problem: Skin Integrity: Goal: Risk for impaired skin integrity will decrease Outcome: Progressing   Problem: Education: Goal: Understanding of CV disease, CV risk reduction, and recovery process will improve Outcome: Progressing   Problem: Cardiovascular: Goal: Vascular access site(s) Level 0-1 will be maintained Outcome: Progressing   Problem: Health Behavior/Discharge Planning: Goal: Ability to safely manage health-related needs after discharge will improve Outcome: Progressing

## 2024-04-10 NOTE — Discharge Summary (Signed)
 Physician Discharge Summary      Patient ID: Wendy Potter MRN: [id number] DOB/AGE: [birth year] 33 y.o.  Admit date: 04/09/2024 Discharge date: 04/10/2024  Admission Diagnoses: Active Problems:   AVM (arteriovenous malformation) brain  Discharge Diagnoses:  Active Problems:   AVM (arteriovenous malformation) brain    Procedures: Procedure(s):  Status post complete occlusion of the dominant left occipital artery supplying the scalp arteriovenous malformation using multiple coils to complete occlusion.   Discharged Condition: good  Hospital Course: Pt admitted 6/2 after successful coil embolization of dominant left occipital artery branch to AVF resulting in complete occlusion. Pt tolerated procedure well with no immediate complications. Reports resolution of pulsatile sxs. Mild dull ache in region of AVM, no other neuro sxs. Has tolerated diet and been OOB.  Pt stable for discharge All medications, instructions, and follow up plans discussed with pt and husband at bedside.  Consults: None   Discharge Exam: Blood pressure 109/70, pulse 74, temperature 98.2 F (36.8 C), temperature source Oral, resp. rate 20, height 5\' 7"  (1.702 m), weight 135 lb (61.2 kg), SpO2 99%. NAD, A&O x 4 Lungs: CTA without w/r/r Heart: Regular Neuro: PERRLA, EOMI No deficits, face symmetric, tongue midline. Strength intact 5/5 UE and LE Ext: (R)groin soft, min tenderness, no hematoma Bounding pedal pulses.   Disposition: Discharge disposition: 01-Home or Self Care       Discharge Instructions     Call MD for:  persistant nausea and vomiting   Complete by: As directed    Call MD for:  redness, tenderness, or signs of infection (pain, swelling, redness, odor or green/yellow discharge around incision site)   Complete by: As directed    Call MD for:  severe uncontrolled pain   Complete by: As directed    Diet general   Complete by: As directed    Driving Restrictions   Complete by:  As directed    Avoid driving for 2 weeks   Increase activity slowly   Complete by: As directed    Lifting restrictions   Complete by: As directed    No pushing, pulling, or lifting >10lbs for 2 weeks   Remove dressing in 24 hours   Complete by: As directed       Allergies as of 04/10/2024   No Known Allergies      Medication List     TAKE these medications    acetaminophen  500 MG tablet Commonly known as: TYLENOL  Take 500 mg by mouth every 8 (eight) hours as needed for moderate pain (pain score 4-6).   mupirocin  ointment 2 % Commonly known as: BACTROBAN  Apply 1 Application topically 2 (two) times daily.        Follow-up Information     Wendy Sages, MD Follow up in 2 week(s).   Specialties: Interventional Radiology, Radiology Why: Post procedure eval Clinic will call you to schedule Contact information: [address] [place] [address] [phone]                 Signed: Prudence Brown PA-C 04/10/2024, 9:09 AM

## 2024-04-10 NOTE — Progress Notes (Signed)
 D/C education given to Pt and husband, all questions answered. No printed prescriptions to give or equipment to deliver. IV removed. Pt taken to car with all belongings.

## 2024-04-10 NOTE — Anesthesia Postprocedure Evaluation (Signed)
 Anesthesia Post Note  Patient: Wendy Potter  Procedure(s) Performed: RADIOLOGY WITH ANESTHESIA     Patient location during evaluation: PACU Anesthesia Type: General Level of consciousness: awake and alert Pain management: pain level controlled Vital Signs Assessment: post-procedure vital signs reviewed and stable Respiratory status: spontaneous breathing, nonlabored ventilation, respiratory function stable and patient connected to nasal cannula oxygen Cardiovascular status: blood pressure returned to baseline and stable Postop Assessment: no apparent nausea or vomiting Anesthetic complications: no   No notable events documented.  Last Vitals:  Vitals:   04/10/24 0630 04/10/24 0700  BP:  111/69  Pulse: 69 72  Resp: (!) 9 11  Temp:    SpO2: 99% 91%    Last Pain:  Vitals:   04/10/24 0715  TempSrc:   PainSc: 0-No pain                 Mareena Cavan S

## 2024-04-11 DIAGNOSIS — Q283 Other malformations of cerebral vessels: Secondary | ICD-10-CM | POA: Insufficient documentation

## 2024-04-12 ENCOUNTER — Encounter (HOSPITAL_COMMUNITY): Payer: Self-pay

## 2024-04-12 ENCOUNTER — Other Ambulatory Visit (HOSPITAL_COMMUNITY): Payer: Self-pay | Admitting: Interventional Radiology

## 2024-04-12 DIAGNOSIS — H93A9 Pulsatile tinnitus, unspecified ear: Secondary | ICD-10-CM

## 2024-04-12 DIAGNOSIS — Q282 Arteriovenous malformation of cerebral vessels: Secondary | ICD-10-CM

## 2024-04-16 ENCOUNTER — Other Ambulatory Visit (HOSPITAL_COMMUNITY): Payer: Self-pay | Admitting: Interventional Radiology

## 2024-04-16 DIAGNOSIS — Q2730 Arteriovenous malformation, site unspecified: Secondary | ICD-10-CM

## 2024-04-16 DIAGNOSIS — H93A9 Pulsatile tinnitus, unspecified ear: Secondary | ICD-10-CM

## 2024-04-24 ENCOUNTER — Ambulatory Visit (HOSPITAL_COMMUNITY)
Admission: RE | Admit: 2024-04-24 | Discharge: 2024-04-24 | Disposition: A | Discharge status: Home / Self Care | Source: Ambulatory Visit | Attending: Interventional Radiology | Admitting: Interventional Radiology

## 2024-04-24 DIAGNOSIS — Q2730 Arteriovenous malformation, site unspecified: Secondary | ICD-10-CM

## 2024-04-24 DIAGNOSIS — M542 Cervicalgia: Secondary | ICD-10-CM | POA: Diagnosis not present

## 2024-04-24 DIAGNOSIS — H93A9 Pulsatile tinnitus, unspecified ear: Secondary | ICD-10-CM

## 2024-04-25 HISTORY — PX: IR RADIOLOGIST EVAL & MGMT: IMG5224

## 2024-05-04 ENCOUNTER — Encounter (HOSPITAL_COMMUNITY): Payer: Self-pay | Admitting: Interventional Radiology

## 2024-05-17 DIAGNOSIS — I671 Cerebral aneurysm, nonruptured: Secondary | ICD-10-CM | POA: Diagnosis not present

## 2024-05-18 NOTE — Progress Notes (Signed)
 Cc: doing well, f/u  HPI: Wendy Potter f/u after her AVM resection. Intra-operative angio confirmed complete resection. Her incision is well healed. She has minimal residual numbness in the area of her incision.  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  A/P UIA. I have discussed indications, risks, benefits and alternatives to aneurysm embolization with the patient in detail. At this time, she most comfortable with observation.   She will have a local MRA next year with a phone f/u thereafter. Lasheka already scheduled everything.

## 2024-06-07 ENCOUNTER — Encounter (HOSPITAL_BASED_OUTPATIENT_CLINIC_OR_DEPARTMENT_OTHER): Payer: Self-pay | Admitting: *Deleted

## 2024-06-07 ENCOUNTER — Encounter (HOSPITAL_BASED_OUTPATIENT_CLINIC_OR_DEPARTMENT_OTHER): Payer: Self-pay | Admitting: Family Medicine

## 2024-07-27 ENCOUNTER — Other Ambulatory Visit (HOSPITAL_BASED_OUTPATIENT_CLINIC_OR_DEPARTMENT_OTHER): Payer: Self-pay | Admitting: Family Medicine

## 2024-07-27 ENCOUNTER — Ambulatory Visit (INDEPENDENT_AMBULATORY_CARE_PROVIDER_SITE_OTHER): Admitting: Family Medicine

## 2024-07-27 ENCOUNTER — Encounter (HOSPITAL_BASED_OUTPATIENT_CLINIC_OR_DEPARTMENT_OTHER): Payer: Self-pay | Admitting: Family Medicine

## 2024-07-27 VITALS — BP 116/74 | HR 57 | Temp 97.7°F | Resp 16 | Wt 139.1 lb

## 2024-07-27 DIAGNOSIS — R519 Headache, unspecified: Secondary | ICD-10-CM | POA: Diagnosis not present

## 2024-07-27 DIAGNOSIS — Z9889 Other specified postprocedural states: Secondary | ICD-10-CM

## 2024-07-27 DIAGNOSIS — G43909 Migraine, unspecified, not intractable, without status migrainosus: Secondary | ICD-10-CM

## 2024-07-27 MED ORDER — SUMATRIPTAN 20 MG/ACT NA SOLN
20.0000 mg | NASAL | 5 refills | Status: AC | PRN
Start: 2024-07-27 — End: ?

## 2024-07-27 NOTE — Assessment & Plan Note (Addendum)
 Unclear etiology.  She agrees to message her team at [facility] for their input.  No plans for a Tryptan, etc yet.  Stress reduction techniques.  I discussed a multitude of treatment options including Elavil at hs, etc.  She declines this for now.  Keep HA diary.  Commended on healthy habits since she stopped smoking.  She also understands I'm happy to refer her to Neurology if she wishes.
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
## 2024-09-14 DIAGNOSIS — Z6822 Body mass index (BMI) 22.0-22.9, adult: Secondary | ICD-10-CM | POA: Diagnosis not present

## 2024-09-14 DIAGNOSIS — Z01419 Encounter for gynecological examination (general) (routine) without abnormal findings: Secondary | ICD-10-CM | POA: Diagnosis not present
